# Patient Record
Sex: Female | Born: 1940 | Race: Black or African American | Hispanic: No
Health system: Southern US, Community
[De-identification: ages and names within clinical notes are randomized; demographics above are authoritative.]

---

## 2015-07-04 DIAGNOSIS — J069 Acute upper respiratory infection, unspecified: Secondary | ICD-10-CM | POA: Diagnosis not present

## 2015-09-21 DIAGNOSIS — L03031 Cellulitis of right toe: Secondary | ICD-10-CM | POA: Diagnosis not present

## 2015-09-21 DIAGNOSIS — M79671 Pain in right foot: Secondary | ICD-10-CM | POA: Diagnosis not present

## 2015-09-21 DIAGNOSIS — L600 Ingrowing nail: Secondary | ICD-10-CM | POA: Diagnosis not present

## 2015-09-21 DIAGNOSIS — M79674 Pain in right toe(s): Secondary | ICD-10-CM | POA: Diagnosis not present

## 2015-12-21 DIAGNOSIS — R5383 Other fatigue: Secondary | ICD-10-CM | POA: Diagnosis not present

## 2015-12-21 DIAGNOSIS — E7800 Pure hypercholesterolemia, unspecified: Secondary | ICD-10-CM | POA: Diagnosis not present

## 2015-12-21 DIAGNOSIS — Z1389 Encounter for screening for other disorder: Secondary | ICD-10-CM | POA: Diagnosis not present

## 2015-12-21 DIAGNOSIS — Z1211 Encounter for screening for malignant neoplasm of colon: Secondary | ICD-10-CM | POA: Diagnosis not present

## 2015-12-21 DIAGNOSIS — Z6831 Body mass index (BMI) 31.0-31.9, adult: Secondary | ICD-10-CM | POA: Diagnosis not present

## 2015-12-21 DIAGNOSIS — Z299 Encounter for prophylactic measures, unspecified: Secondary | ICD-10-CM | POA: Diagnosis not present

## 2015-12-21 DIAGNOSIS — Z79899 Other long term (current) drug therapy: Secondary | ICD-10-CM | POA: Diagnosis not present

## 2015-12-21 DIAGNOSIS — Z7189 Other specified counseling: Secondary | ICD-10-CM | POA: Diagnosis not present

## 2015-12-21 DIAGNOSIS — Z0000 Encounter for general adult medical examination without abnormal findings: Secondary | ICD-10-CM | POA: Diagnosis not present

## 2016-01-01 DIAGNOSIS — E7800 Pure hypercholesterolemia, unspecified: Secondary | ICD-10-CM | POA: Diagnosis not present

## 2016-01-01 DIAGNOSIS — I10 Essential (primary) hypertension: Secondary | ICD-10-CM | POA: Diagnosis not present

## 2016-02-05 DIAGNOSIS — I10 Essential (primary) hypertension: Secondary | ICD-10-CM | POA: Diagnosis not present

## 2016-02-05 DIAGNOSIS — E7800 Pure hypercholesterolemia, unspecified: Secondary | ICD-10-CM | POA: Diagnosis not present

## 2016-02-11 DIAGNOSIS — E2839 Other primary ovarian failure: Secondary | ICD-10-CM | POA: Diagnosis not present

## 2016-02-22 DIAGNOSIS — Z1231 Encounter for screening mammogram for malignant neoplasm of breast: Secondary | ICD-10-CM | POA: Diagnosis not present

## 2016-02-29 DIAGNOSIS — J019 Acute sinusitis, unspecified: Secondary | ICD-10-CM | POA: Diagnosis not present

## 2016-02-29 DIAGNOSIS — H6121 Impacted cerumen, right ear: Secondary | ICD-10-CM | POA: Diagnosis not present

## 2016-02-29 DIAGNOSIS — I10 Essential (primary) hypertension: Secondary | ICD-10-CM | POA: Diagnosis not present

## 2016-03-02 DIAGNOSIS — Z23 Encounter for immunization: Secondary | ICD-10-CM | POA: Diagnosis not present

## 2016-03-04 DIAGNOSIS — I10 Essential (primary) hypertension: Secondary | ICD-10-CM | POA: Diagnosis not present

## 2016-03-04 DIAGNOSIS — E7800 Pure hypercholesterolemia, unspecified: Secondary | ICD-10-CM | POA: Diagnosis not present

## 2016-03-17 DIAGNOSIS — Z1211 Encounter for screening for malignant neoplasm of colon: Secondary | ICD-10-CM | POA: Diagnosis not present

## 2016-03-17 DIAGNOSIS — M8580 Other specified disorders of bone density and structure, unspecified site: Secondary | ICD-10-CM | POA: Diagnosis not present

## 2016-03-17 DIAGNOSIS — M1990 Unspecified osteoarthritis, unspecified site: Secondary | ICD-10-CM | POA: Diagnosis not present

## 2016-03-17 DIAGNOSIS — K644 Residual hemorrhoidal skin tags: Secondary | ICD-10-CM | POA: Diagnosis not present

## 2016-03-17 DIAGNOSIS — I10 Essential (primary) hypertension: Secondary | ICD-10-CM | POA: Diagnosis not present

## 2016-03-17 DIAGNOSIS — Z6829 Body mass index (BMI) 29.0-29.9, adult: Secondary | ICD-10-CM | POA: Diagnosis not present

## 2016-03-17 DIAGNOSIS — E669 Obesity, unspecified: Secondary | ICD-10-CM | POA: Diagnosis not present

## 2016-04-01 DIAGNOSIS — I10 Essential (primary) hypertension: Secondary | ICD-10-CM | POA: Diagnosis not present

## 2016-04-01 DIAGNOSIS — E7800 Pure hypercholesterolemia, unspecified: Secondary | ICD-10-CM | POA: Diagnosis not present

## 2016-05-25 DIAGNOSIS — I10 Essential (primary) hypertension: Secondary | ICD-10-CM | POA: Diagnosis not present

## 2016-05-25 DIAGNOSIS — E7800 Pure hypercholesterolemia, unspecified: Secondary | ICD-10-CM | POA: Diagnosis not present

## 2016-07-14 DIAGNOSIS — E7800 Pure hypercholesterolemia, unspecified: Secondary | ICD-10-CM | POA: Diagnosis not present

## 2016-07-14 DIAGNOSIS — I10 Essential (primary) hypertension: Secondary | ICD-10-CM | POA: Diagnosis not present

## 2016-08-11 DIAGNOSIS — E7800 Pure hypercholesterolemia, unspecified: Secondary | ICD-10-CM | POA: Diagnosis not present

## 2016-08-11 DIAGNOSIS — I10 Essential (primary) hypertension: Secondary | ICD-10-CM | POA: Diagnosis not present

## 2016-09-09 DIAGNOSIS — E7800 Pure hypercholesterolemia, unspecified: Secondary | ICD-10-CM | POA: Diagnosis not present

## 2016-09-09 DIAGNOSIS — I10 Essential (primary) hypertension: Secondary | ICD-10-CM | POA: Diagnosis not present

## 2016-10-11 DIAGNOSIS — E7800 Pure hypercholesterolemia, unspecified: Secondary | ICD-10-CM | POA: Diagnosis not present

## 2016-10-11 DIAGNOSIS — I10 Essential (primary) hypertension: Secondary | ICD-10-CM | POA: Diagnosis not present

## 2016-11-01 DIAGNOSIS — E7800 Pure hypercholesterolemia, unspecified: Secondary | ICD-10-CM | POA: Diagnosis not present

## 2016-11-01 DIAGNOSIS — I10 Essential (primary) hypertension: Secondary | ICD-10-CM | POA: Diagnosis not present

## 2016-12-06 DIAGNOSIS — I10 Essential (primary) hypertension: Secondary | ICD-10-CM | POA: Diagnosis not present

## 2016-12-06 DIAGNOSIS — E7800 Pure hypercholesterolemia, unspecified: Secondary | ICD-10-CM | POA: Diagnosis not present

## 2016-12-27 DIAGNOSIS — Z7189 Other specified counseling: Secondary | ICD-10-CM | POA: Diagnosis not present

## 2016-12-27 DIAGNOSIS — M8580 Other specified disorders of bone density and structure, unspecified site: Secondary | ICD-10-CM | POA: Diagnosis not present

## 2016-12-27 DIAGNOSIS — R5383 Other fatigue: Secondary | ICD-10-CM | POA: Diagnosis not present

## 2016-12-27 DIAGNOSIS — Z299 Encounter for prophylactic measures, unspecified: Secondary | ICD-10-CM | POA: Diagnosis not present

## 2016-12-27 DIAGNOSIS — Z79899 Other long term (current) drug therapy: Secondary | ICD-10-CM | POA: Diagnosis not present

## 2016-12-27 DIAGNOSIS — I10 Essential (primary) hypertension: Secondary | ICD-10-CM | POA: Diagnosis not present

## 2016-12-27 DIAGNOSIS — F419 Anxiety disorder, unspecified: Secondary | ICD-10-CM | POA: Diagnosis not present

## 2016-12-27 DIAGNOSIS — Z6832 Body mass index (BMI) 32.0-32.9, adult: Secondary | ICD-10-CM | POA: Diagnosis not present

## 2016-12-27 DIAGNOSIS — Z0000 Encounter for general adult medical examination without abnormal findings: Secondary | ICD-10-CM | POA: Diagnosis not present

## 2016-12-27 DIAGNOSIS — Z1389 Encounter for screening for other disorder: Secondary | ICD-10-CM | POA: Diagnosis not present

## 2016-12-27 DIAGNOSIS — E7800 Pure hypercholesterolemia, unspecified: Secondary | ICD-10-CM | POA: Diagnosis not present

## 2016-12-28 DIAGNOSIS — E7800 Pure hypercholesterolemia, unspecified: Secondary | ICD-10-CM | POA: Diagnosis not present

## 2016-12-28 DIAGNOSIS — I10 Essential (primary) hypertension: Secondary | ICD-10-CM | POA: Diagnosis not present

## 2017-02-09 DIAGNOSIS — E7800 Pure hypercholesterolemia, unspecified: Secondary | ICD-10-CM | POA: Diagnosis not present

## 2017-02-09 DIAGNOSIS — I10 Essential (primary) hypertension: Secondary | ICD-10-CM | POA: Diagnosis not present

## 2017-02-24 DIAGNOSIS — Z1231 Encounter for screening mammogram for malignant neoplasm of breast: Secondary | ICD-10-CM | POA: Diagnosis not present

## 2017-03-04 DIAGNOSIS — Z23 Encounter for immunization: Secondary | ICD-10-CM | POA: Diagnosis not present

## 2017-05-01 DIAGNOSIS — I10 Essential (primary) hypertension: Secondary | ICD-10-CM | POA: Diagnosis not present

## 2017-05-01 DIAGNOSIS — E7800 Pure hypercholesterolemia, unspecified: Secondary | ICD-10-CM | POA: Diagnosis not present

## 2017-06-12 DIAGNOSIS — I10 Essential (primary) hypertension: Secondary | ICD-10-CM | POA: Diagnosis not present

## 2017-06-12 DIAGNOSIS — E7800 Pure hypercholesterolemia, unspecified: Secondary | ICD-10-CM | POA: Diagnosis not present

## 2017-07-03 DIAGNOSIS — M1711 Unilateral primary osteoarthritis, right knee: Secondary | ICD-10-CM | POA: Diagnosis not present

## 2017-07-03 DIAGNOSIS — M25561 Pain in right knee: Secondary | ICD-10-CM | POA: Diagnosis not present

## 2017-07-13 DIAGNOSIS — I10 Essential (primary) hypertension: Secondary | ICD-10-CM | POA: Diagnosis not present

## 2017-07-13 DIAGNOSIS — E7800 Pure hypercholesterolemia, unspecified: Secondary | ICD-10-CM | POA: Diagnosis not present

## 2017-07-16 DIAGNOSIS — J209 Acute bronchitis, unspecified: Secondary | ICD-10-CM | POA: Diagnosis not present

## 2017-07-27 DIAGNOSIS — E7800 Pure hypercholesterolemia, unspecified: Secondary | ICD-10-CM | POA: Diagnosis not present

## 2017-07-27 DIAGNOSIS — I10 Essential (primary) hypertension: Secondary | ICD-10-CM | POA: Diagnosis not present

## 2017-10-18 DIAGNOSIS — E7800 Pure hypercholesterolemia, unspecified: Secondary | ICD-10-CM | POA: Diagnosis not present

## 2017-10-18 DIAGNOSIS — I10 Essential (primary) hypertension: Secondary | ICD-10-CM | POA: Diagnosis not present

## 2017-11-03 DIAGNOSIS — E7800 Pure hypercholesterolemia, unspecified: Secondary | ICD-10-CM | POA: Diagnosis not present

## 2017-11-03 DIAGNOSIS — I10 Essential (primary) hypertension: Secondary | ICD-10-CM | POA: Diagnosis not present

## 2017-12-15 DIAGNOSIS — E7800 Pure hypercholesterolemia, unspecified: Secondary | ICD-10-CM | POA: Diagnosis not present

## 2017-12-15 DIAGNOSIS — I10 Essential (primary) hypertension: Secondary | ICD-10-CM | POA: Diagnosis not present

## 2018-01-04 DIAGNOSIS — E7800 Pure hypercholesterolemia, unspecified: Secondary | ICD-10-CM | POA: Diagnosis not present

## 2018-01-04 DIAGNOSIS — R5383 Other fatigue: Secondary | ICD-10-CM | POA: Diagnosis not present

## 2018-01-04 DIAGNOSIS — Z6832 Body mass index (BMI) 32.0-32.9, adult: Secondary | ICD-10-CM | POA: Diagnosis not present

## 2018-01-04 DIAGNOSIS — Z1339 Encounter for screening examination for other mental health and behavioral disorders: Secondary | ICD-10-CM | POA: Diagnosis not present

## 2018-01-04 DIAGNOSIS — Z0000 Encounter for general adult medical examination without abnormal findings: Secondary | ICD-10-CM | POA: Diagnosis not present

## 2018-01-04 DIAGNOSIS — Z299 Encounter for prophylactic measures, unspecified: Secondary | ICD-10-CM | POA: Diagnosis not present

## 2018-01-04 DIAGNOSIS — Z1331 Encounter for screening for depression: Secondary | ICD-10-CM | POA: Diagnosis not present

## 2018-01-04 DIAGNOSIS — I10 Essential (primary) hypertension: Secondary | ICD-10-CM | POA: Diagnosis not present

## 2018-01-04 DIAGNOSIS — Z7189 Other specified counseling: Secondary | ICD-10-CM | POA: Diagnosis not present

## 2018-01-04 DIAGNOSIS — Z79899 Other long term (current) drug therapy: Secondary | ICD-10-CM | POA: Diagnosis not present

## 2018-01-04 DIAGNOSIS — Z1211 Encounter for screening for malignant neoplasm of colon: Secondary | ICD-10-CM | POA: Diagnosis not present

## 2018-01-04 DIAGNOSIS — Z789 Other specified health status: Secondary | ICD-10-CM | POA: Diagnosis not present

## 2018-02-01 DIAGNOSIS — I10 Essential (primary) hypertension: Secondary | ICD-10-CM | POA: Diagnosis not present

## 2018-02-01 DIAGNOSIS — E7800 Pure hypercholesterolemia, unspecified: Secondary | ICD-10-CM | POA: Diagnosis not present

## 2018-02-06 DIAGNOSIS — M79671 Pain in right foot: Secondary | ICD-10-CM | POA: Diagnosis not present

## 2018-02-06 DIAGNOSIS — M25579 Pain in unspecified ankle and joints of unspecified foot: Secondary | ICD-10-CM | POA: Diagnosis not present

## 2018-02-14 DIAGNOSIS — E2839 Other primary ovarian failure: Secondary | ICD-10-CM | POA: Diagnosis not present

## 2018-02-27 DIAGNOSIS — E7800 Pure hypercholesterolemia, unspecified: Secondary | ICD-10-CM | POA: Diagnosis not present

## 2018-02-27 DIAGNOSIS — I10 Essential (primary) hypertension: Secondary | ICD-10-CM | POA: Diagnosis not present

## 2018-03-03 DIAGNOSIS — Z23 Encounter for immunization: Secondary | ICD-10-CM | POA: Diagnosis not present

## 2018-03-06 DIAGNOSIS — M79671 Pain in right foot: Secondary | ICD-10-CM | POA: Diagnosis not present

## 2018-03-06 DIAGNOSIS — M25579 Pain in unspecified ankle and joints of unspecified foot: Secondary | ICD-10-CM | POA: Diagnosis not present

## 2018-03-23 DIAGNOSIS — E7800 Pure hypercholesterolemia, unspecified: Secondary | ICD-10-CM | POA: Diagnosis not present

## 2018-03-23 DIAGNOSIS — I10 Essential (primary) hypertension: Secondary | ICD-10-CM | POA: Diagnosis not present

## 2018-03-27 DIAGNOSIS — M79671 Pain in right foot: Secondary | ICD-10-CM | POA: Diagnosis not present

## 2018-03-27 DIAGNOSIS — M25579 Pain in unspecified ankle and joints of unspecified foot: Secondary | ICD-10-CM | POA: Diagnosis not present

## 2018-04-04 DIAGNOSIS — Z1231 Encounter for screening mammogram for malignant neoplasm of breast: Secondary | ICD-10-CM | POA: Diagnosis not present

## 2018-04-17 DIAGNOSIS — M79672 Pain in left foot: Secondary | ICD-10-CM | POA: Diagnosis not present

## 2018-04-17 DIAGNOSIS — I739 Peripheral vascular disease, unspecified: Secondary | ICD-10-CM | POA: Diagnosis not present

## 2018-04-17 DIAGNOSIS — M79671 Pain in right foot: Secondary | ICD-10-CM | POA: Diagnosis not present

## 2018-04-24 DIAGNOSIS — E7800 Pure hypercholesterolemia, unspecified: Secondary | ICD-10-CM | POA: Diagnosis not present

## 2018-04-24 DIAGNOSIS — I10 Essential (primary) hypertension: Secondary | ICD-10-CM | POA: Diagnosis not present

## 2018-06-13 DIAGNOSIS — I10 Essential (primary) hypertension: Secondary | ICD-10-CM | POA: Diagnosis not present

## 2018-06-13 DIAGNOSIS — E7800 Pure hypercholesterolemia, unspecified: Secondary | ICD-10-CM | POA: Diagnosis not present

## 2018-07-10 DIAGNOSIS — I739 Peripheral vascular disease, unspecified: Secondary | ICD-10-CM | POA: Diagnosis not present

## 2018-07-10 DIAGNOSIS — M79671 Pain in right foot: Secondary | ICD-10-CM | POA: Diagnosis not present

## 2018-07-10 DIAGNOSIS — M79672 Pain in left foot: Secondary | ICD-10-CM | POA: Diagnosis not present

## 2018-07-11 DIAGNOSIS — E7800 Pure hypercholesterolemia, unspecified: Secondary | ICD-10-CM | POA: Diagnosis not present

## 2018-07-11 DIAGNOSIS — I10 Essential (primary) hypertension: Secondary | ICD-10-CM | POA: Diagnosis not present

## 2018-08-07 DIAGNOSIS — E7800 Pure hypercholesterolemia, unspecified: Secondary | ICD-10-CM | POA: Diagnosis not present

## 2018-08-07 DIAGNOSIS — I10 Essential (primary) hypertension: Secondary | ICD-10-CM | POA: Diagnosis not present

## 2018-09-13 DIAGNOSIS — E7800 Pure hypercholesterolemia, unspecified: Secondary | ICD-10-CM | POA: Diagnosis not present

## 2018-09-13 DIAGNOSIS — I10 Essential (primary) hypertension: Secondary | ICD-10-CM | POA: Diagnosis not present

## 2018-10-02 DIAGNOSIS — M79671 Pain in right foot: Secondary | ICD-10-CM | POA: Diagnosis not present

## 2018-10-02 DIAGNOSIS — I739 Peripheral vascular disease, unspecified: Secondary | ICD-10-CM | POA: Diagnosis not present

## 2018-10-02 DIAGNOSIS — M79672 Pain in left foot: Secondary | ICD-10-CM | POA: Diagnosis not present

## 2018-10-10 DIAGNOSIS — I10 Essential (primary) hypertension: Secondary | ICD-10-CM | POA: Diagnosis not present

## 2018-10-10 DIAGNOSIS — E7800 Pure hypercholesterolemia, unspecified: Secondary | ICD-10-CM | POA: Diagnosis not present

## 2018-11-08 DIAGNOSIS — E7800 Pure hypercholesterolemia, unspecified: Secondary | ICD-10-CM | POA: Diagnosis not present

## 2018-11-08 DIAGNOSIS — I10 Essential (primary) hypertension: Secondary | ICD-10-CM | POA: Diagnosis not present

## 2018-12-07 DIAGNOSIS — E7800 Pure hypercholesterolemia, unspecified: Secondary | ICD-10-CM | POA: Diagnosis not present

## 2018-12-07 DIAGNOSIS — I10 Essential (primary) hypertension: Secondary | ICD-10-CM | POA: Diagnosis not present

## 2018-12-18 DIAGNOSIS — M25774 Osteophyte, right foot: Secondary | ICD-10-CM | POA: Diagnosis not present

## 2018-12-18 DIAGNOSIS — M25775 Osteophyte, left foot: Secondary | ICD-10-CM | POA: Diagnosis not present

## 2018-12-18 DIAGNOSIS — M79671 Pain in right foot: Secondary | ICD-10-CM | POA: Diagnosis not present

## 2018-12-18 DIAGNOSIS — M79672 Pain in left foot: Secondary | ICD-10-CM | POA: Diagnosis not present

## 2019-01-07 DIAGNOSIS — E7800 Pure hypercholesterolemia, unspecified: Secondary | ICD-10-CM | POA: Diagnosis not present

## 2019-01-07 DIAGNOSIS — I10 Essential (primary) hypertension: Secondary | ICD-10-CM | POA: Diagnosis not present

## 2019-01-14 DIAGNOSIS — Z79899 Other long term (current) drug therapy: Secondary | ICD-10-CM | POA: Diagnosis not present

## 2019-01-14 DIAGNOSIS — Z6834 Body mass index (BMI) 34.0-34.9, adult: Secondary | ICD-10-CM | POA: Diagnosis not present

## 2019-01-14 DIAGNOSIS — I10 Essential (primary) hypertension: Secondary | ICD-10-CM | POA: Diagnosis not present

## 2019-01-14 DIAGNOSIS — R5383 Other fatigue: Secondary | ICD-10-CM | POA: Diagnosis not present

## 2019-01-14 DIAGNOSIS — Z0000 Encounter for general adult medical examination without abnormal findings: Secondary | ICD-10-CM | POA: Diagnosis not present

## 2019-01-14 DIAGNOSIS — Z1339 Encounter for screening examination for other mental health and behavioral disorders: Secondary | ICD-10-CM | POA: Diagnosis not present

## 2019-01-14 DIAGNOSIS — Z1331 Encounter for screening for depression: Secondary | ICD-10-CM | POA: Diagnosis not present

## 2019-01-14 DIAGNOSIS — F419 Anxiety disorder, unspecified: Secondary | ICD-10-CM | POA: Diagnosis not present

## 2019-01-14 DIAGNOSIS — E559 Vitamin D deficiency, unspecified: Secondary | ICD-10-CM | POA: Diagnosis not present

## 2019-01-14 DIAGNOSIS — E7800 Pure hypercholesterolemia, unspecified: Secondary | ICD-10-CM | POA: Diagnosis not present

## 2019-01-14 DIAGNOSIS — Z1211 Encounter for screening for malignant neoplasm of colon: Secondary | ICD-10-CM | POA: Diagnosis not present

## 2019-01-14 DIAGNOSIS — Z299 Encounter for prophylactic measures, unspecified: Secondary | ICD-10-CM | POA: Diagnosis not present

## 2019-01-14 DIAGNOSIS — Z7189 Other specified counseling: Secondary | ICD-10-CM | POA: Diagnosis not present

## 2019-02-05 DIAGNOSIS — I10 Essential (primary) hypertension: Secondary | ICD-10-CM | POA: Diagnosis not present

## 2019-02-05 DIAGNOSIS — E7800 Pure hypercholesterolemia, unspecified: Secondary | ICD-10-CM | POA: Diagnosis not present

## 2019-02-06 DIAGNOSIS — Z23 Encounter for immunization: Secondary | ICD-10-CM | POA: Diagnosis not present

## 2019-02-26 DIAGNOSIS — M79671 Pain in right foot: Secondary | ICD-10-CM | POA: Diagnosis not present

## 2019-02-26 DIAGNOSIS — M79672 Pain in left foot: Secondary | ICD-10-CM | POA: Diagnosis not present

## 2019-02-26 DIAGNOSIS — I739 Peripheral vascular disease, unspecified: Secondary | ICD-10-CM | POA: Diagnosis not present

## 2019-03-05 DIAGNOSIS — I10 Essential (primary) hypertension: Secondary | ICD-10-CM | POA: Diagnosis not present

## 2019-03-05 DIAGNOSIS — E7800 Pure hypercholesterolemia, unspecified: Secondary | ICD-10-CM | POA: Diagnosis not present

## 2019-04-08 DIAGNOSIS — Z1231 Encounter for screening mammogram for malignant neoplasm of breast: Secondary | ICD-10-CM | POA: Diagnosis not present

## 2019-05-07 DIAGNOSIS — I10 Essential (primary) hypertension: Secondary | ICD-10-CM | POA: Diagnosis not present

## 2019-05-07 DIAGNOSIS — E7800 Pure hypercholesterolemia, unspecified: Secondary | ICD-10-CM | POA: Diagnosis not present

## 2019-06-05 DIAGNOSIS — I10 Essential (primary) hypertension: Secondary | ICD-10-CM | POA: Diagnosis not present

## 2019-06-05 DIAGNOSIS — E7800 Pure hypercholesterolemia, unspecified: Secondary | ICD-10-CM | POA: Diagnosis not present

## 2019-07-03 DIAGNOSIS — E7800 Pure hypercholesterolemia, unspecified: Secondary | ICD-10-CM | POA: Diagnosis not present

## 2019-07-03 DIAGNOSIS — I10 Essential (primary) hypertension: Secondary | ICD-10-CM | POA: Diagnosis not present

## 2019-07-13 DIAGNOSIS — Z23 Encounter for immunization: Secondary | ICD-10-CM | POA: Diagnosis not present

## 2019-07-23 DIAGNOSIS — M79672 Pain in left foot: Secondary | ICD-10-CM | POA: Diagnosis not present

## 2019-07-23 DIAGNOSIS — I739 Peripheral vascular disease, unspecified: Secondary | ICD-10-CM | POA: Diagnosis not present

## 2019-07-23 DIAGNOSIS — M79671 Pain in right foot: Secondary | ICD-10-CM | POA: Diagnosis not present

## 2019-07-30 DIAGNOSIS — I10 Essential (primary) hypertension: Secondary | ICD-10-CM | POA: Diagnosis not present

## 2019-07-30 DIAGNOSIS — E7800 Pure hypercholesterolemia, unspecified: Secondary | ICD-10-CM | POA: Diagnosis not present

## 2019-08-10 DIAGNOSIS — Z23 Encounter for immunization: Secondary | ICD-10-CM | POA: Diagnosis not present

## 2019-09-12 DIAGNOSIS — E7800 Pure hypercholesterolemia, unspecified: Secondary | ICD-10-CM | POA: Diagnosis not present

## 2019-09-12 DIAGNOSIS — I10 Essential (primary) hypertension: Secondary | ICD-10-CM | POA: Diagnosis not present

## 2019-10-08 DIAGNOSIS — M79671 Pain in right foot: Secondary | ICD-10-CM | POA: Diagnosis not present

## 2019-10-08 DIAGNOSIS — M79672 Pain in left foot: Secondary | ICD-10-CM | POA: Diagnosis not present

## 2019-10-08 DIAGNOSIS — I739 Peripheral vascular disease, unspecified: Secondary | ICD-10-CM | POA: Diagnosis not present

## 2019-10-20 DIAGNOSIS — E7800 Pure hypercholesterolemia, unspecified: Secondary | ICD-10-CM | POA: Diagnosis not present

## 2019-10-20 DIAGNOSIS — I10 Essential (primary) hypertension: Secondary | ICD-10-CM | POA: Diagnosis not present

## 2019-11-20 DIAGNOSIS — E7800 Pure hypercholesterolemia, unspecified: Secondary | ICD-10-CM | POA: Diagnosis not present

## 2019-11-20 DIAGNOSIS — I10 Essential (primary) hypertension: Secondary | ICD-10-CM | POA: Diagnosis not present

## 2019-12-20 DIAGNOSIS — E7800 Pure hypercholesterolemia, unspecified: Secondary | ICD-10-CM | POA: Diagnosis not present

## 2019-12-20 DIAGNOSIS — I10 Essential (primary) hypertension: Secondary | ICD-10-CM | POA: Diagnosis not present

## 2019-12-24 DIAGNOSIS — M79672 Pain in left foot: Secondary | ICD-10-CM | POA: Diagnosis not present

## 2019-12-24 DIAGNOSIS — I739 Peripheral vascular disease, unspecified: Secondary | ICD-10-CM | POA: Diagnosis not present

## 2019-12-24 DIAGNOSIS — M79671 Pain in right foot: Secondary | ICD-10-CM | POA: Diagnosis not present

## 2019-12-26 DIAGNOSIS — E7800 Pure hypercholesterolemia, unspecified: Secondary | ICD-10-CM | POA: Diagnosis not present

## 2019-12-26 DIAGNOSIS — I10 Essential (primary) hypertension: Secondary | ICD-10-CM | POA: Diagnosis not present

## 2020-01-29 DIAGNOSIS — H2513 Age-related nuclear cataract, bilateral: Secondary | ICD-10-CM | POA: Diagnosis not present

## 2020-01-29 DIAGNOSIS — H2512 Age-related nuclear cataract, left eye: Secondary | ICD-10-CM | POA: Diagnosis not present

## 2020-02-03 DIAGNOSIS — Z6833 Body mass index (BMI) 33.0-33.9, adult: Secondary | ICD-10-CM | POA: Diagnosis not present

## 2020-02-03 DIAGNOSIS — Z299 Encounter for prophylactic measures, unspecified: Secondary | ICD-10-CM | POA: Diagnosis not present

## 2020-02-03 DIAGNOSIS — E7800 Pure hypercholesterolemia, unspecified: Secondary | ICD-10-CM | POA: Diagnosis not present

## 2020-02-03 DIAGNOSIS — I10 Essential (primary) hypertension: Secondary | ICD-10-CM | POA: Diagnosis not present

## 2020-02-03 DIAGNOSIS — Z1331 Encounter for screening for depression: Secondary | ICD-10-CM | POA: Diagnosis not present

## 2020-02-03 DIAGNOSIS — R5383 Other fatigue: Secondary | ICD-10-CM | POA: Diagnosis not present

## 2020-02-03 DIAGNOSIS — Z79899 Other long term (current) drug therapy: Secondary | ICD-10-CM | POA: Diagnosis not present

## 2020-02-03 DIAGNOSIS — Z0000 Encounter for general adult medical examination without abnormal findings: Secondary | ICD-10-CM | POA: Diagnosis not present

## 2020-02-03 DIAGNOSIS — Z1339 Encounter for screening examination for other mental health and behavioral disorders: Secondary | ICD-10-CM | POA: Diagnosis not present

## 2020-02-03 DIAGNOSIS — Z7189 Other specified counseling: Secondary | ICD-10-CM | POA: Diagnosis not present

## 2020-02-20 DIAGNOSIS — I10 Essential (primary) hypertension: Secondary | ICD-10-CM | POA: Diagnosis not present

## 2020-02-20 DIAGNOSIS — E7800 Pure hypercholesterolemia, unspecified: Secondary | ICD-10-CM | POA: Diagnosis not present

## 2020-03-04 DIAGNOSIS — H2512 Age-related nuclear cataract, left eye: Secondary | ICD-10-CM | POA: Diagnosis not present

## 2020-03-09 DIAGNOSIS — Z23 Encounter for immunization: Secondary | ICD-10-CM | POA: Diagnosis not present

## 2020-03-10 DIAGNOSIS — I739 Peripheral vascular disease, unspecified: Secondary | ICD-10-CM | POA: Diagnosis not present

## 2020-03-10 DIAGNOSIS — M79672 Pain in left foot: Secondary | ICD-10-CM | POA: Diagnosis not present

## 2020-03-10 DIAGNOSIS — M79671 Pain in right foot: Secondary | ICD-10-CM | POA: Diagnosis not present

## 2020-03-18 DIAGNOSIS — H2511 Age-related nuclear cataract, right eye: Secondary | ICD-10-CM | POA: Diagnosis not present

## 2020-03-20 DIAGNOSIS — E7800 Pure hypercholesterolemia, unspecified: Secondary | ICD-10-CM | POA: Diagnosis not present

## 2020-03-20 DIAGNOSIS — I10 Essential (primary) hypertension: Secondary | ICD-10-CM | POA: Diagnosis not present

## 2020-04-06 DIAGNOSIS — E2839 Other primary ovarian failure: Secondary | ICD-10-CM | POA: Diagnosis not present

## 2020-04-09 DIAGNOSIS — Z1231 Encounter for screening mammogram for malignant neoplasm of breast: Secondary | ICD-10-CM | POA: Diagnosis not present

## 2020-04-21 DIAGNOSIS — E7800 Pure hypercholesterolemia, unspecified: Secondary | ICD-10-CM | POA: Diagnosis not present

## 2020-04-21 DIAGNOSIS — I10 Essential (primary) hypertension: Secondary | ICD-10-CM | POA: Diagnosis not present

## 2020-05-21 DIAGNOSIS — I10 Essential (primary) hypertension: Secondary | ICD-10-CM | POA: Diagnosis not present

## 2020-05-21 DIAGNOSIS — E7800 Pure hypercholesterolemia, unspecified: Secondary | ICD-10-CM | POA: Diagnosis not present

## 2020-06-02 DIAGNOSIS — M79672 Pain in left foot: Secondary | ICD-10-CM | POA: Diagnosis not present

## 2020-06-02 DIAGNOSIS — I739 Peripheral vascular disease, unspecified: Secondary | ICD-10-CM | POA: Diagnosis not present

## 2020-06-02 DIAGNOSIS — M79671 Pain in right foot: Secondary | ICD-10-CM | POA: Diagnosis not present

## 2020-08-17 DIAGNOSIS — I739 Peripheral vascular disease, unspecified: Secondary | ICD-10-CM | POA: Diagnosis not present

## 2020-08-17 DIAGNOSIS — M79672 Pain in left foot: Secondary | ICD-10-CM | POA: Diagnosis not present

## 2020-08-17 DIAGNOSIS — M79671 Pain in right foot: Secondary | ICD-10-CM | POA: Diagnosis not present

## 2020-09-07 DIAGNOSIS — Z23 Encounter for immunization: Secondary | ICD-10-CM | POA: Diagnosis not present

## 2020-11-09 DIAGNOSIS — I739 Peripheral vascular disease, unspecified: Secondary | ICD-10-CM | POA: Diagnosis not present

## 2020-11-09 DIAGNOSIS — Z299 Encounter for prophylactic measures, unspecified: Secondary | ICD-10-CM | POA: Diagnosis not present

## 2020-11-09 DIAGNOSIS — M79671 Pain in right foot: Secondary | ICD-10-CM | POA: Diagnosis not present

## 2020-11-09 DIAGNOSIS — I10 Essential (primary) hypertension: Secondary | ICD-10-CM | POA: Diagnosis not present

## 2020-11-09 DIAGNOSIS — M79672 Pain in left foot: Secondary | ICD-10-CM | POA: Diagnosis not present

## 2020-11-09 DIAGNOSIS — M7522 Bicipital tendinitis, left shoulder: Secondary | ICD-10-CM | POA: Diagnosis not present

## 2021-02-04 DIAGNOSIS — Z6832 Body mass index (BMI) 32.0-32.9, adult: Secondary | ICD-10-CM | POA: Diagnosis not present

## 2021-02-04 DIAGNOSIS — Z1339 Encounter for screening examination for other mental health and behavioral disorders: Secondary | ICD-10-CM | POA: Diagnosis not present

## 2021-02-04 DIAGNOSIS — Z1331 Encounter for screening for depression: Secondary | ICD-10-CM | POA: Diagnosis not present

## 2021-02-04 DIAGNOSIS — E7800 Pure hypercholesterolemia, unspecified: Secondary | ICD-10-CM | POA: Diagnosis not present

## 2021-02-04 DIAGNOSIS — Z0000 Encounter for general adult medical examination without abnormal findings: Secondary | ICD-10-CM | POA: Diagnosis not present

## 2021-02-04 DIAGNOSIS — Z299 Encounter for prophylactic measures, unspecified: Secondary | ICD-10-CM | POA: Diagnosis not present

## 2021-02-04 DIAGNOSIS — I10 Essential (primary) hypertension: Secondary | ICD-10-CM | POA: Diagnosis not present

## 2021-02-04 DIAGNOSIS — Z79899 Other long term (current) drug therapy: Secondary | ICD-10-CM | POA: Diagnosis not present

## 2021-02-04 DIAGNOSIS — Z7189 Other specified counseling: Secondary | ICD-10-CM | POA: Diagnosis not present

## 2021-02-04 DIAGNOSIS — R5383 Other fatigue: Secondary | ICD-10-CM | POA: Diagnosis not present

## 2021-02-04 DIAGNOSIS — Z23 Encounter for immunization: Secondary | ICD-10-CM | POA: Diagnosis not present

## 2021-03-23 ENCOUNTER — Other Ambulatory Visit: Payer: Self-pay | Admitting: Internal Medicine

## 2021-03-23 DIAGNOSIS — Z139 Encounter for screening, unspecified: Secondary | ICD-10-CM

## 2021-03-30 ENCOUNTER — Other Ambulatory Visit: Payer: Self-pay

## 2021-03-30 ENCOUNTER — Ambulatory Visit
Admission: RE | Admit: 2021-03-30 | Discharge: 2021-03-30 | Disposition: A | Discharge status: Home / Self Care | Payer: Medicare Other | Source: Ambulatory Visit | Attending: Internal Medicine | Admitting: Internal Medicine

## 2021-03-30 DIAGNOSIS — Z139 Encounter for screening, unspecified: Secondary | ICD-10-CM

## 2021-03-30 DIAGNOSIS — Z1231 Encounter for screening mammogram for malignant neoplasm of breast: Secondary | ICD-10-CM | POA: Diagnosis not present

## 2021-04-05 DIAGNOSIS — Z23 Encounter for immunization: Secondary | ICD-10-CM | POA: Diagnosis not present

## 2021-05-10 DIAGNOSIS — E7800 Pure hypercholesterolemia, unspecified: Secondary | ICD-10-CM | POA: Diagnosis not present

## 2021-05-10 DIAGNOSIS — Z6833 Body mass index (BMI) 33.0-33.9, adult: Secondary | ICD-10-CM | POA: Diagnosis not present

## 2021-05-10 DIAGNOSIS — M25561 Pain in right knee: Secondary | ICD-10-CM | POA: Diagnosis not present

## 2021-05-10 DIAGNOSIS — I10 Essential (primary) hypertension: Secondary | ICD-10-CM | POA: Diagnosis not present

## 2021-05-10 DIAGNOSIS — Z79899 Other long term (current) drug therapy: Secondary | ICD-10-CM | POA: Diagnosis not present

## 2021-05-10 DIAGNOSIS — Z299 Encounter for prophylactic measures, unspecified: Secondary | ICD-10-CM | POA: Diagnosis not present

## 2021-06-21 DIAGNOSIS — Z800 Family history of malignant neoplasm of digestive organs: Secondary | ICD-10-CM | POA: Diagnosis not present

## 2021-07-29 DIAGNOSIS — Z800 Family history of malignant neoplasm of digestive organs: Secondary | ICD-10-CM | POA: Diagnosis not present

## 2021-07-29 DIAGNOSIS — Z79899 Other long term (current) drug therapy: Secondary | ICD-10-CM | POA: Diagnosis not present

## 2021-07-29 DIAGNOSIS — Z1211 Encounter for screening for malignant neoplasm of colon: Secondary | ICD-10-CM | POA: Diagnosis not present

## 2021-07-29 DIAGNOSIS — K649 Unspecified hemorrhoids: Secondary | ICD-10-CM | POA: Diagnosis not present

## 2021-07-29 DIAGNOSIS — I10 Essential (primary) hypertension: Secondary | ICD-10-CM | POA: Diagnosis not present

## 2021-07-29 DIAGNOSIS — Q438 Other specified congenital malformations of intestine: Secondary | ICD-10-CM | POA: Diagnosis not present

## 2021-07-29 DIAGNOSIS — K644 Residual hemorrhoidal skin tags: Secondary | ICD-10-CM | POA: Diagnosis not present

## 2021-11-02 DIAGNOSIS — Z713 Dietary counseling and surveillance: Secondary | ICD-10-CM | POA: Diagnosis not present

## 2021-11-02 DIAGNOSIS — Z299 Encounter for prophylactic measures, unspecified: Secondary | ICD-10-CM | POA: Diagnosis not present

## 2021-11-02 DIAGNOSIS — M25561 Pain in right knee: Secondary | ICD-10-CM | POA: Diagnosis not present

## 2021-11-02 DIAGNOSIS — Z6832 Body mass index (BMI) 32.0-32.9, adult: Secondary | ICD-10-CM | POA: Diagnosis not present

## 2021-11-02 DIAGNOSIS — I10 Essential (primary) hypertension: Secondary | ICD-10-CM | POA: Diagnosis not present

## 2021-12-03 DIAGNOSIS — Z6832 Body mass index (BMI) 32.0-32.9, adult: Secondary | ICD-10-CM | POA: Diagnosis not present

## 2021-12-03 DIAGNOSIS — R2681 Unsteadiness on feet: Secondary | ICD-10-CM | POA: Diagnosis not present

## 2021-12-03 DIAGNOSIS — M1711 Unilateral primary osteoarthritis, right knee: Secondary | ICD-10-CM | POA: Diagnosis not present

## 2021-12-03 DIAGNOSIS — Z299 Encounter for prophylactic measures, unspecified: Secondary | ICD-10-CM | POA: Diagnosis not present

## 2021-12-03 DIAGNOSIS — M1712 Unilateral primary osteoarthritis, left knee: Secondary | ICD-10-CM | POA: Diagnosis not present

## 2021-12-03 DIAGNOSIS — I10 Essential (primary) hypertension: Secondary | ICD-10-CM | POA: Diagnosis not present

## 2021-12-03 DIAGNOSIS — M25561 Pain in right knee: Secondary | ICD-10-CM | POA: Diagnosis not present

## 2021-12-03 DIAGNOSIS — R296 Repeated falls: Secondary | ICD-10-CM | POA: Diagnosis not present

## 2021-12-03 DIAGNOSIS — M170 Bilateral primary osteoarthritis of knee: Secondary | ICD-10-CM | POA: Diagnosis not present

## 2021-12-03 DIAGNOSIS — M25562 Pain in left knee: Secondary | ICD-10-CM | POA: Diagnosis not present

## 2021-12-29 DIAGNOSIS — J3089 Other allergic rhinitis: Secondary | ICD-10-CM | POA: Diagnosis not present

## 2021-12-29 DIAGNOSIS — Z20822 Contact with and (suspected) exposure to covid-19: Secondary | ICD-10-CM | POA: Diagnosis not present

## 2021-12-29 DIAGNOSIS — J069 Acute upper respiratory infection, unspecified: Secondary | ICD-10-CM | POA: Diagnosis not present

## 2022-01-28 DIAGNOSIS — I10 Essential (primary) hypertension: Secondary | ICD-10-CM | POA: Diagnosis not present

## 2022-01-28 DIAGNOSIS — Z299 Encounter for prophylactic measures, unspecified: Secondary | ICD-10-CM | POA: Diagnosis not present

## 2022-01-28 DIAGNOSIS — M25562 Pain in left knee: Secondary | ICD-10-CM | POA: Diagnosis not present

## 2022-01-28 DIAGNOSIS — M170 Bilateral primary osteoarthritis of knee: Secondary | ICD-10-CM | POA: Diagnosis not present

## 2022-01-28 DIAGNOSIS — Z6831 Body mass index (BMI) 31.0-31.9, adult: Secondary | ICD-10-CM | POA: Diagnosis not present

## 2022-01-28 DIAGNOSIS — R260 Ataxic gait: Secondary | ICD-10-CM | POA: Diagnosis not present

## 2022-01-28 DIAGNOSIS — M25561 Pain in right knee: Secondary | ICD-10-CM | POA: Diagnosis not present

## 2022-02-14 DIAGNOSIS — I10 Essential (primary) hypertension: Secondary | ICD-10-CM | POA: Diagnosis not present

## 2022-02-14 DIAGNOSIS — Z7189 Other specified counseling: Secondary | ICD-10-CM | POA: Diagnosis not present

## 2022-02-14 DIAGNOSIS — Z23 Encounter for immunization: Secondary | ICD-10-CM | POA: Diagnosis not present

## 2022-02-14 DIAGNOSIS — Z1331 Encounter for screening for depression: Secondary | ICD-10-CM | POA: Diagnosis not present

## 2022-02-14 DIAGNOSIS — Z299 Encounter for prophylactic measures, unspecified: Secondary | ICD-10-CM | POA: Diagnosis not present

## 2022-02-14 DIAGNOSIS — R5383 Other fatigue: Secondary | ICD-10-CM | POA: Diagnosis not present

## 2022-02-14 DIAGNOSIS — Z6831 Body mass index (BMI) 31.0-31.9, adult: Secondary | ICD-10-CM | POA: Diagnosis not present

## 2022-02-14 DIAGNOSIS — E7800 Pure hypercholesterolemia, unspecified: Secondary | ICD-10-CM | POA: Diagnosis not present

## 2022-02-14 DIAGNOSIS — Z79899 Other long term (current) drug therapy: Secondary | ICD-10-CM | POA: Diagnosis not present

## 2022-02-14 DIAGNOSIS — Z1339 Encounter for screening examination for other mental health and behavioral disorders: Secondary | ICD-10-CM | POA: Diagnosis not present

## 2022-02-14 DIAGNOSIS — Z0000 Encounter for general adult medical examination without abnormal findings: Secondary | ICD-10-CM | POA: Diagnosis not present

## 2022-02-21 DIAGNOSIS — Z23 Encounter for immunization: Secondary | ICD-10-CM | POA: Diagnosis not present

## 2022-02-24 DIAGNOSIS — M1711 Unilateral primary osteoarthritis, right knee: Secondary | ICD-10-CM | POA: Diagnosis not present

## 2022-02-25 DIAGNOSIS — M1712 Unilateral primary osteoarthritis, left knee: Secondary | ICD-10-CM | POA: Diagnosis not present

## 2022-03-03 DIAGNOSIS — M1711 Unilateral primary osteoarthritis, right knee: Secondary | ICD-10-CM | POA: Diagnosis not present

## 2022-03-04 DIAGNOSIS — M1712 Unilateral primary osteoarthritis, left knee: Secondary | ICD-10-CM | POA: Diagnosis not present

## 2022-03-10 DIAGNOSIS — M1711 Unilateral primary osteoarthritis, right knee: Secondary | ICD-10-CM | POA: Diagnosis not present

## 2022-03-11 DIAGNOSIS — M1712 Unilateral primary osteoarthritis, left knee: Secondary | ICD-10-CM | POA: Diagnosis not present

## 2022-03-17 DIAGNOSIS — M1711 Unilateral primary osteoarthritis, right knee: Secondary | ICD-10-CM | POA: Diagnosis not present

## 2022-03-18 DIAGNOSIS — M1712 Unilateral primary osteoarthritis, left knee: Secondary | ICD-10-CM | POA: Diagnosis not present

## 2022-03-30 ENCOUNTER — Other Ambulatory Visit: Payer: Self-pay | Admitting: Internal Medicine

## 2022-03-30 DIAGNOSIS — Z1231 Encounter for screening mammogram for malignant neoplasm of breast: Secondary | ICD-10-CM

## 2022-04-13 ENCOUNTER — Ambulatory Visit
Admission: RE | Admit: 2022-04-13 | Discharge: 2022-04-13 | Disposition: A | Discharge status: Home / Self Care | Payer: Medicare Other | Source: Ambulatory Visit | Attending: Internal Medicine | Admitting: Internal Medicine

## 2022-04-13 DIAGNOSIS — Z1231 Encounter for screening mammogram for malignant neoplasm of breast: Secondary | ICD-10-CM

## 2022-04-27 ENCOUNTER — Ambulatory Visit (INDEPENDENT_AMBULATORY_CARE_PROVIDER_SITE_OTHER): Payer: Medicare Other | Admitting: Urology

## 2022-04-27 ENCOUNTER — Encounter: Payer: Self-pay | Admitting: Urology

## 2022-04-27 VITALS — BP 146/78 | HR 62 | Wt 179.0 lb

## 2022-04-27 DIAGNOSIS — R350 Frequency of micturition: Secondary | ICD-10-CM | POA: Diagnosis not present

## 2022-04-27 LAB — URINALYSIS, ROUTINE W REFLEX MICROSCOPIC
Bilirubin, UA: NEGATIVE
Glucose, UA: NEGATIVE
Ketones, UA: NEGATIVE
Nitrite, UA: NEGATIVE
Protein,UA: NEGATIVE
RBC, UA: NEGATIVE
Specific Gravity, UA: 1.02 (ref 1.005–1.030)
Urobilinogen, Ur: 0.2 mg/dL (ref 0.2–1.0)
pH, UA: 7.5 (ref 5.0–7.5)

## 2022-04-27 LAB — MICROSCOPIC EXAMINATION: Bacteria, UA: NONE SEEN

## 2022-04-27 MED ORDER — MIRABEGRON ER 25 MG PO TB24: 25.0000 mg | ORAL_TABLET | Freq: Every day | ORAL | 0 refills | Status: DC

## 2022-04-27 NOTE — Progress Notes (Signed)
   04/27/2022 9:51 AM   Beverly Evans May 16, 1941 [id number]  Referring provider: No referring provider defined for this encounter.  nocturia   HPI: Ms Bosko is a 81yo here for evaluation of urinary frequency and nocturia. For over 2 years she has noted urinary frequency every 2-3 hours and nocturia 3x. She has incontinence at night on the way to the bathroom. She has 1+ lower extremity edema. She has not tried an OAB meds.    PMH: No past medical history on file.  Surgical History: No past surgical history on file.  Home Medications:  Allergies as of 04/27/2022   No Known Allergies      Medication List        Accurate as of April 27, 2022  9:51 AM. If you have any questions, ask your nurse or doctor.          amLODipine 10 MG tablet Commonly known as: NORVASC Take 1 tablet by mouth daily.   rosuvastatin 10 MG tablet Commonly known as: CRESTOR Take 10 mg by mouth daily.        Allergies: No Known Allergies  Family History: Family History  Problem Relation Age of Onset   Breast cancer Paternal Grandmother    Breast cancer Paternal Great-grandmother     Social History:  has no history on file for tobacco use, alcohol use, and drug use.  ROS: All other review of systems were reviewed and are negative except what is noted above in HPI  Physical Exam: BP (!) 146/78   Pulse 62   Wt 179 lb (81.2 kg)   Constitutional:  Alert and oriented, No acute distress. HEENT: [place] AT, moist mucus membranes.  Trachea midline, no masses. Cardiovascular: No clubbing, cyanosis, or edema. Respiratory: Normal respiratory effort, no increased work of breathing. GI: Abdomen is soft, nontender, nondistended, no abdominal masses GU: No CVA tenderness.  Lymph: No cervical or inguinal lymphadenopathy. Skin: No rashes, bruises or suspicious lesions. Neurologic: Grossly intact, no focal deficits, moving all 4 extremities. Psychiatric: Normal mood and  affect.  Laboratory Data: No results found for: "WBC", "HGB", "HCT", "MCV", "PLT"  No results found for: "CREATININE"  No results found for: "PSA"  No results found for: "TESTOSTERONE"  No results found for: "HGBA1C"  Urinalysis No results found for: "COLORURINE", "APPEARANCEUR", "LABSPEC", "PHURINE", "GLUCOSEU", "HGBUR", "BILIRUBINUR", "KETONESUR", "PROTEINUR", "UROBILINOGEN", "NITRITE", "LEUKOCYTESUR"  No results found for: "LABMICR", "WBCUA", "RBCUA", "LABEPIT", "MUCUS", "BACTERIA"  Pertinent Imaging:  No results found for this or any previous visit.  No results found for this or any previous visit.  No results found for this or any previous visit.  No results found for this or any previous visit.  No results found for this or any previous visit.  No valid procedures specified. No results found for this or any previous visit.  No results found for this or any previous visit.   Assessment & Plan:    1. Frequent urination -We will trial mirabegron 25mg  daily - Urinalysis, Routine w reflex microscopic - BLADDER SCAN AMB NON-IMAGING   No follow-ups on file.  , MD  [facility] [place]

## 2022-04-27 NOTE — Patient Instructions (Signed)

## 2022-05-19 ENCOUNTER — Telehealth: Payer: Self-pay

## 2022-05-19 DIAGNOSIS — R350 Frequency of micturition: Secondary | ICD-10-CM

## 2022-05-19 MED ORDER — MIRABEGRON ER 25 MG PO TB24: 25.0000 mg | ORAL_TABLET | Freq: Every day | ORAL | 0 refills | Status: DC

## 2022-05-19 NOTE — Telephone Encounter (Signed)
Rx sent to pharmacy   

## 2022-05-19 NOTE — Telephone Encounter (Signed)
Voice message 05-19-22.   The samples given work. Needing a prescription called in.   [facility] [id number] - [place], [place] - [phone] Phone: [phone]  Fax: [phone]     Let pt know this was sent in.  Call back: [phone] (H)   Thanks, Rosey Bath

## 2022-05-30 ENCOUNTER — Other Ambulatory Visit: Payer: Self-pay

## 2022-05-30 ENCOUNTER — Telehealth: Payer: Self-pay

## 2022-05-30 DIAGNOSIS — R350 Frequency of micturition: Secondary | ICD-10-CM

## 2022-05-30 MED ORDER — MIRABEGRON ER 25 MG PO TB24: 25.0000 mg | ORAL_TABLET | Freq: Every day | ORAL | 11 refills | Status: AC

## 2022-05-30 NOTE — Telephone Encounter (Signed)
Patient called and advised they needed medication called into pharmacy.    Medication: mirabegron ER (MYRBETRIQ) 25 MG TB24 tablet    Pharmacy: [facility] [id number] - [place], [place]    Thank you

## 2022-11-04 IMAGING — MG MM DIGITAL SCREENING BILAT W/ TOMO AND CAD
6 of 10 series · 6 of 30 positions shown · non-contrast
Comparison: None.

ACR Breast Density Category a: The breast tissue is almost entirely
fatty.

CLINICAL DATA: Screening.

EXAM:
DIGITAL SCREENING BILATERAL MAMMOGRAM WITH TOMOSYNTHESIS AND CAD
TECHNIQUE: Bilateral screening digital craniocaudal and mediolateral oblique
mammograms were obtained. Bilateral screening digital breast
tomosynthesis was performed. The images were evaluated with
computer-aided detection.

[L MLO synth-2D]
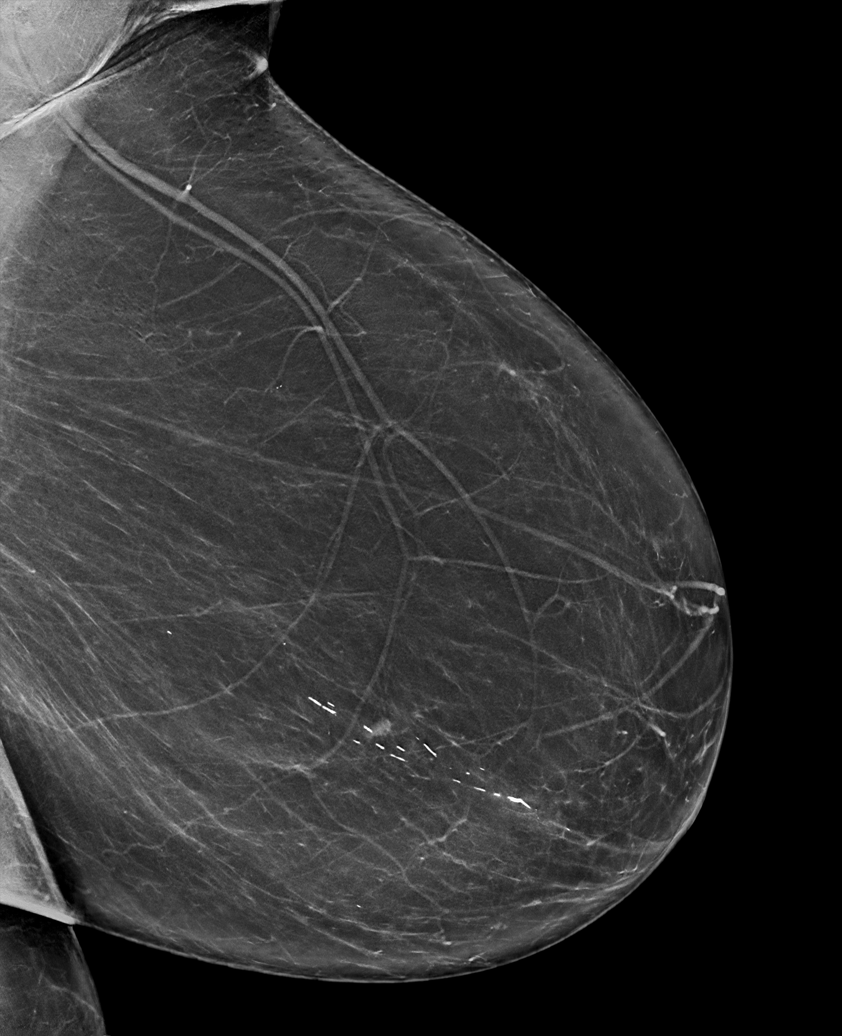

[R CC synth-2D]
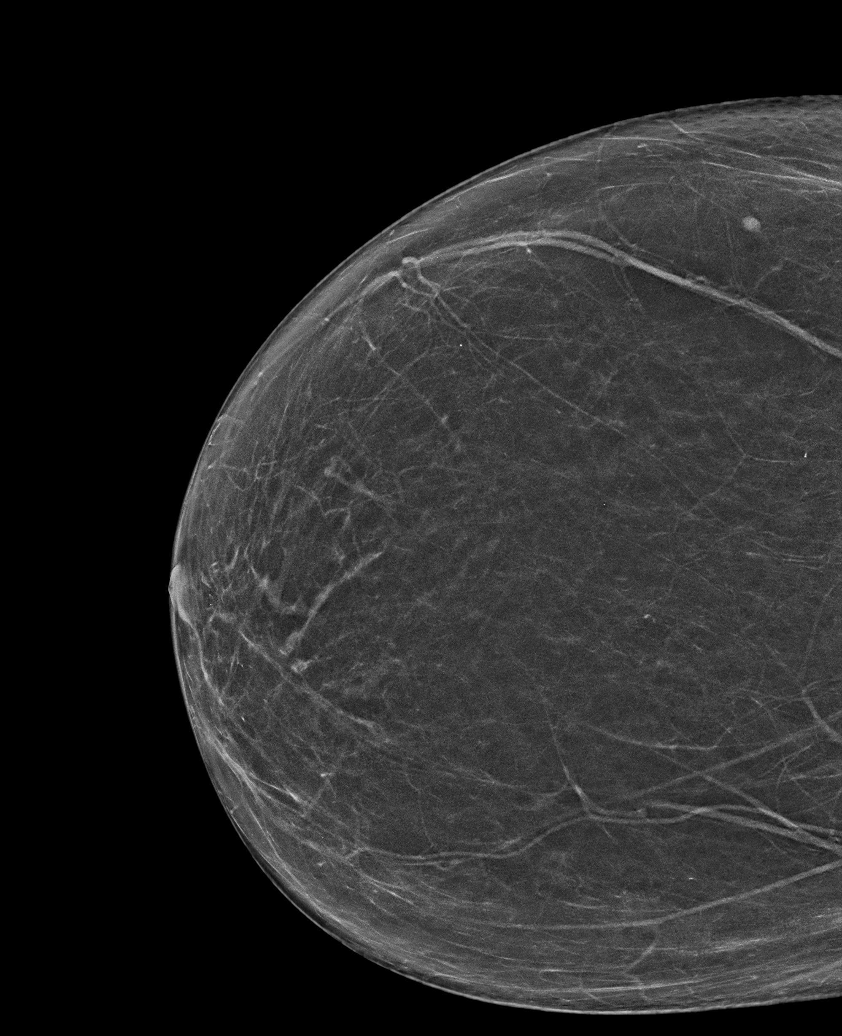

[L ML synth-2D]
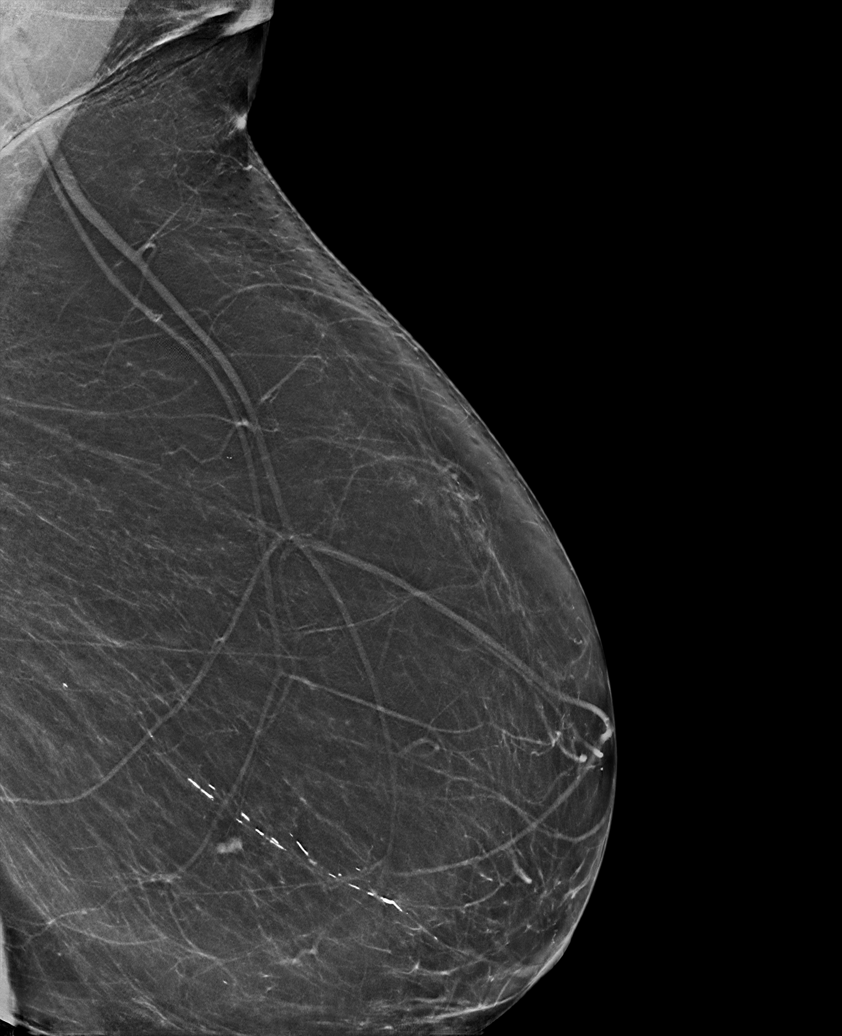

[L CC synth-2D]
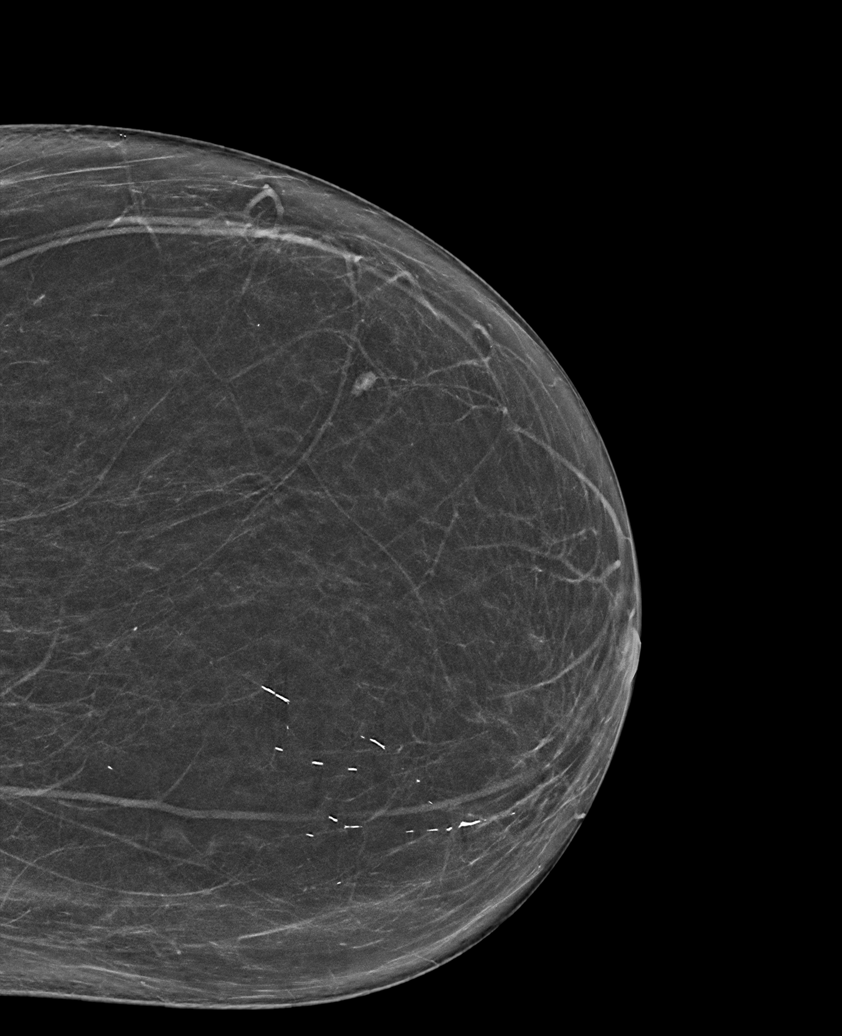

[R MLO synth-2D]
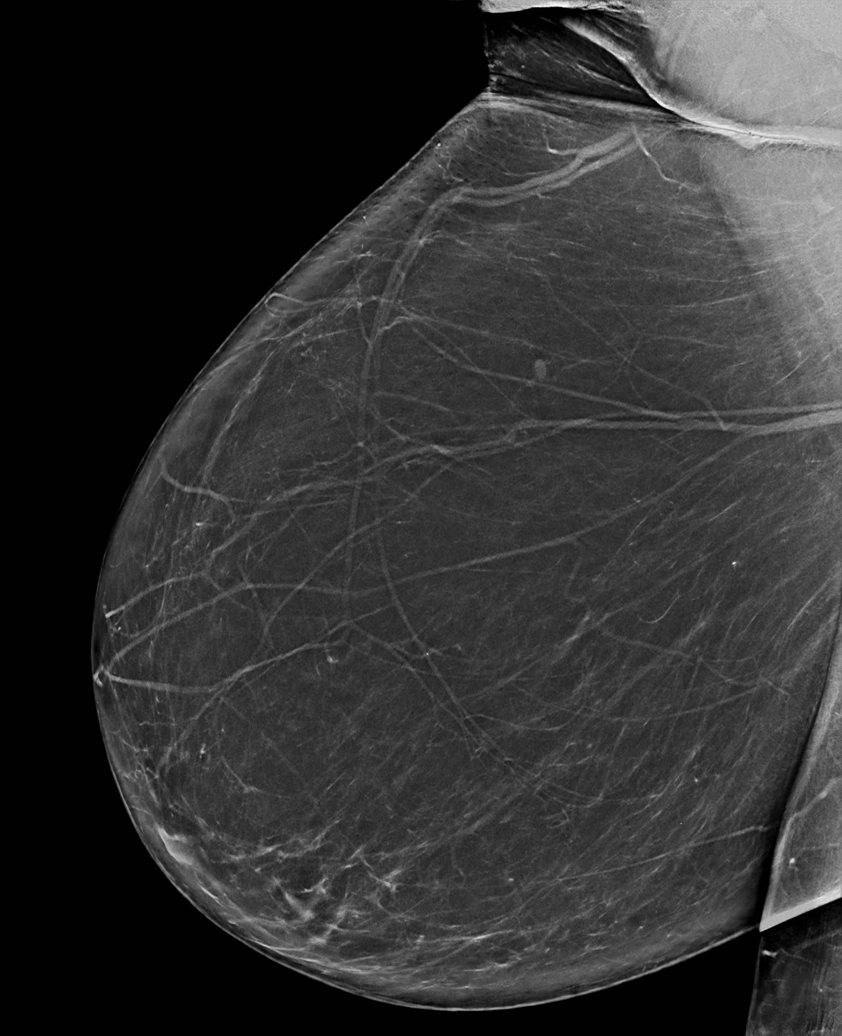

[L MLO tomo · tomo slice 41/82.0]
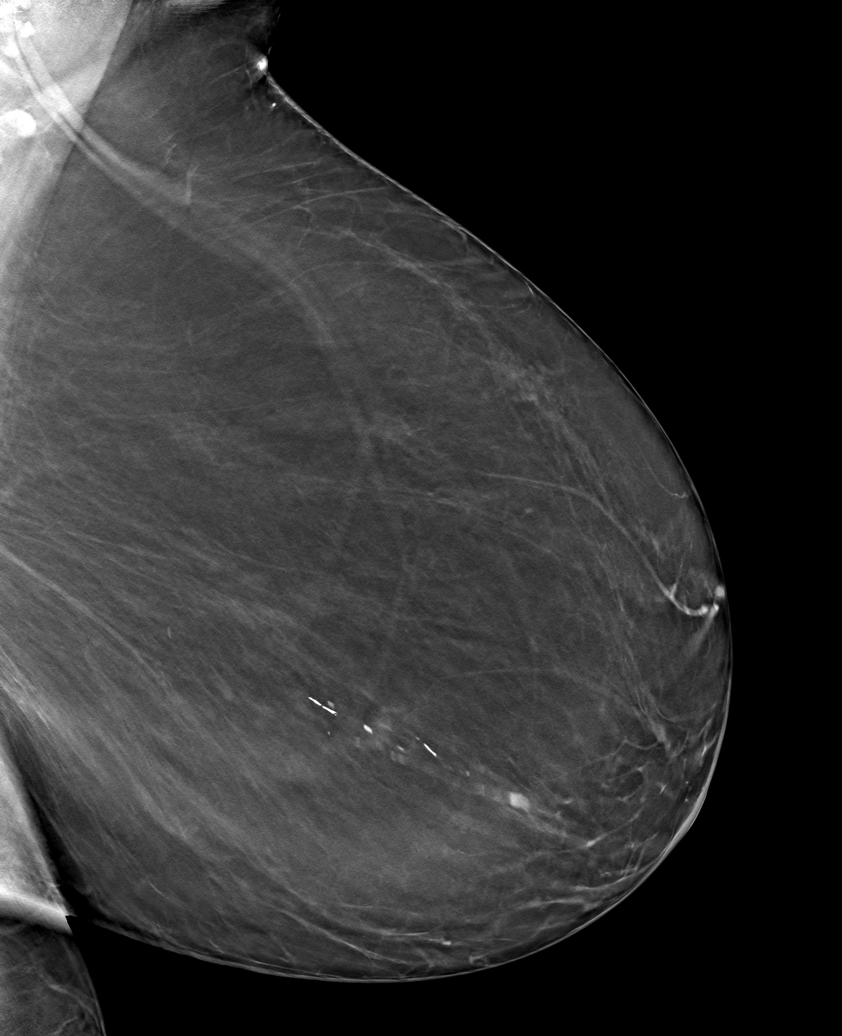

[6 of 30 positions shown; findings below may reference images not displayed]

FINDINGS: There are no findings suspicious for malignancy.
IMPRESSION: No mammographic evidence of malignancy. A result letter of this
screening mammogram will be mailed directly to the patient.

RECOMMENDATION:
Screening mammogram in one year. (Code:44-M-M6Q)

BI-RADS CATEGORY  1: Negative.

## 2023-01-10 DIAGNOSIS — Z23 Encounter for immunization: Secondary | ICD-10-CM | POA: Diagnosis not present

## 2023-01-30 ENCOUNTER — Ambulatory Visit (HOSPITAL_COMMUNITY)
Admission: RE | Admit: 2023-01-30 | Discharge: 2023-01-30 | Disposition: A | Discharge status: Home / Self Care | Payer: Medicare Other | Source: Ambulatory Visit | Attending: Internal Medicine | Admitting: Internal Medicine

## 2023-01-30 ENCOUNTER — Other Ambulatory Visit (HOSPITAL_COMMUNITY): Payer: Self-pay | Admitting: Internal Medicine

## 2023-01-30 DIAGNOSIS — R600 Localized edema: Secondary | ICD-10-CM

## 2023-01-30 DIAGNOSIS — R5383 Other fatigue: Secondary | ICD-10-CM | POA: Diagnosis not present

## 2023-01-30 DIAGNOSIS — Z1331 Encounter for screening for depression: Secondary | ICD-10-CM | POA: Diagnosis not present

## 2023-01-30 DIAGNOSIS — Z6832 Body mass index (BMI) 32.0-32.9, adult: Secondary | ICD-10-CM | POA: Diagnosis not present

## 2023-01-30 DIAGNOSIS — D692 Other nonthrombocytopenic purpura: Secondary | ICD-10-CM | POA: Diagnosis not present

## 2023-01-30 DIAGNOSIS — Z1339 Encounter for screening examination for other mental health and behavioral disorders: Secondary | ICD-10-CM | POA: Diagnosis not present

## 2023-01-30 DIAGNOSIS — M25562 Pain in left knee: Secondary | ICD-10-CM | POA: Diagnosis not present

## 2023-01-30 DIAGNOSIS — E7800 Pure hypercholesterolemia, unspecified: Secondary | ICD-10-CM | POA: Diagnosis not present

## 2023-01-30 DIAGNOSIS — Z23 Encounter for immunization: Secondary | ICD-10-CM | POA: Diagnosis not present

## 2023-01-30 DIAGNOSIS — Z7189 Other specified counseling: Secondary | ICD-10-CM | POA: Diagnosis not present

## 2023-01-30 DIAGNOSIS — I10 Essential (primary) hypertension: Secondary | ICD-10-CM | POA: Diagnosis not present

## 2023-01-30 DIAGNOSIS — Z79899 Other long term (current) drug therapy: Secondary | ICD-10-CM | POA: Diagnosis not present

## 2023-01-30 DIAGNOSIS — Z299 Encounter for prophylactic measures, unspecified: Secondary | ICD-10-CM | POA: Diagnosis not present

## 2023-01-30 DIAGNOSIS — Z0000 Encounter for general adult medical examination without abnormal findings: Secondary | ICD-10-CM | POA: Diagnosis not present

## 2023-02-20 DIAGNOSIS — R600 Localized edema: Secondary | ICD-10-CM | POA: Diagnosis not present

## 2023-02-27 DIAGNOSIS — R600 Localized edema: Secondary | ICD-10-CM | POA: Diagnosis not present

## 2023-02-27 DIAGNOSIS — I10 Essential (primary) hypertension: Secondary | ICD-10-CM | POA: Diagnosis not present

## 2023-02-27 DIAGNOSIS — Z299 Encounter for prophylactic measures, unspecified: Secondary | ICD-10-CM | POA: Diagnosis not present

## 2023-04-04 ENCOUNTER — Ambulatory Visit
Admission: RE | Admit: 2023-04-04 | Discharge: 2023-04-04 | Disposition: A | Discharge status: Home / Self Care | Payer: Medicare Other | Source: Ambulatory Visit | Attending: Internal Medicine | Admitting: Internal Medicine

## 2023-04-04 ENCOUNTER — Other Ambulatory Visit: Payer: Self-pay | Admitting: Internal Medicine

## 2023-04-04 DIAGNOSIS — R600 Localized edema: Secondary | ICD-10-CM | POA: Diagnosis not present

## 2023-04-04 DIAGNOSIS — I10 Essential (primary) hypertension: Secondary | ICD-10-CM | POA: Diagnosis not present

## 2023-04-04 DIAGNOSIS — Z79899 Other long term (current) drug therapy: Secondary | ICD-10-CM | POA: Diagnosis not present

## 2023-04-04 DIAGNOSIS — Z1231 Encounter for screening mammogram for malignant neoplasm of breast: Secondary | ICD-10-CM

## 2023-04-04 DIAGNOSIS — Z299 Encounter for prophylactic measures, unspecified: Secondary | ICD-10-CM | POA: Diagnosis not present

## 2023-05-08 ENCOUNTER — Ambulatory Visit
Admission: RE | Admit: 2023-05-08 | Discharge: 2023-05-08 | Disposition: A | Discharge status: Home / Self Care | Payer: Medicare Other | Source: Ambulatory Visit | Attending: Internal Medicine | Admitting: Internal Medicine

## 2023-05-08 DIAGNOSIS — Z1231 Encounter for screening mammogram for malignant neoplasm of breast: Secondary | ICD-10-CM | POA: Diagnosis not present

## 2023-05-30 DIAGNOSIS — M19071 Primary osteoarthritis, right ankle and foot: Secondary | ICD-10-CM | POA: Diagnosis not present

## 2023-05-30 DIAGNOSIS — I10 Essential (primary) hypertension: Secondary | ICD-10-CM | POA: Diagnosis present

## 2023-05-30 DIAGNOSIS — Z01818 Encounter for other preprocedural examination: Secondary | ICD-10-CM | POA: Diagnosis not present

## 2023-05-30 DIAGNOSIS — Z9889 Other specified postprocedural states: Secondary | ICD-10-CM | POA: Diagnosis not present

## 2023-05-30 DIAGNOSIS — M25571 Pain in right ankle and joints of right foot: Secondary | ICD-10-CM | POA: Diagnosis not present

## 2023-05-30 DIAGNOSIS — M25471 Effusion, right ankle: Secondary | ICD-10-CM | POA: Diagnosis not present

## 2023-05-30 DIAGNOSIS — W1839XA Other fall on same level, initial encounter: Secondary | ICD-10-CM | POA: Diagnosis not present

## 2023-05-30 DIAGNOSIS — I6782 Cerebral ischemia: Secondary | ICD-10-CM | POA: Diagnosis not present

## 2023-05-30 DIAGNOSIS — S82454A Nondisplaced comminuted fracture of shaft of right fibula, initial encounter for closed fracture: Secondary | ICD-10-CM | POA: Diagnosis not present

## 2023-05-30 DIAGNOSIS — S82831A Other fracture of upper and lower end of right fibula, initial encounter for closed fracture: Secondary | ICD-10-CM | POA: Diagnosis not present

## 2023-05-30 DIAGNOSIS — S82301A Unspecified fracture of lower end of right tibia, initial encounter for closed fracture: Secondary | ICD-10-CM | POA: Diagnosis not present

## 2023-05-30 DIAGNOSIS — S8261XA Displaced fracture of lateral malleolus of right fibula, initial encounter for closed fracture: Secondary | ICD-10-CM | POA: Diagnosis not present

## 2023-05-30 DIAGNOSIS — G44309 Post-traumatic headache, unspecified, not intractable: Secondary | ICD-10-CM | POA: Diagnosis not present

## 2023-05-30 DIAGNOSIS — Z7902 Long term (current) use of antithrombotics/antiplatelets: Secondary | ICD-10-CM | POA: Diagnosis not present

## 2023-05-30 DIAGNOSIS — S82851A Displaced trimalleolar fracture of right lower leg, initial encounter for closed fracture: Secondary | ICD-10-CM | POA: Diagnosis present

## 2023-05-30 DIAGNOSIS — S8251XA Displaced fracture of medial malleolus of right tibia, initial encounter for closed fracture: Secondary | ICD-10-CM | POA: Diagnosis not present

## 2023-05-30 DIAGNOSIS — S82401A Unspecified fracture of shaft of right fibula, initial encounter for closed fracture: Secondary | ICD-10-CM | POA: Diagnosis not present

## 2023-05-30 DIAGNOSIS — I6381 Other cerebral infarction due to occlusion or stenosis of small artery: Secondary | ICD-10-CM | POA: Diagnosis not present

## 2023-05-30 DIAGNOSIS — G319 Degenerative disease of nervous system, unspecified: Secondary | ICD-10-CM | POA: Diagnosis not present

## 2023-05-31 DIAGNOSIS — Z01818 Encounter for other preprocedural examination: Secondary | ICD-10-CM | POA: Diagnosis not present

## 2023-05-31 DIAGNOSIS — S82301A Unspecified fracture of lower end of right tibia, initial encounter for closed fracture: Secondary | ICD-10-CM | POA: Diagnosis not present

## 2023-05-31 DIAGNOSIS — Z9889 Other specified postprocedural states: Secondary | ICD-10-CM | POA: Diagnosis not present

## 2023-05-31 DIAGNOSIS — S82851A Displaced trimalleolar fracture of right lower leg, initial encounter for closed fracture: Secondary | ICD-10-CM | POA: Diagnosis not present

## 2023-05-31 DIAGNOSIS — S82401A Unspecified fracture of shaft of right fibula, initial encounter for closed fracture: Secondary | ICD-10-CM | POA: Diagnosis not present

## 2023-06-01 DIAGNOSIS — I6381 Other cerebral infarction due to occlusion or stenosis of small artery: Secondary | ICD-10-CM | POA: Diagnosis not present

## 2023-06-01 DIAGNOSIS — I6782 Cerebral ischemia: Secondary | ICD-10-CM | POA: Diagnosis not present

## 2023-06-01 DIAGNOSIS — I10 Essential (primary) hypertension: Secondary | ICD-10-CM | POA: Diagnosis present

## 2023-06-01 DIAGNOSIS — G319 Degenerative disease of nervous system, unspecified: Secondary | ICD-10-CM | POA: Diagnosis not present

## 2023-06-01 DIAGNOSIS — Z7902 Long term (current) use of antithrombotics/antiplatelets: Secondary | ICD-10-CM | POA: Diagnosis not present

## 2023-06-01 DIAGNOSIS — S82851A Displaced trimalleolar fracture of right lower leg, initial encounter for closed fracture: Secondary | ICD-10-CM | POA: Diagnosis present

## 2023-06-05 DIAGNOSIS — Z7902 Long term (current) use of antithrombotics/antiplatelets: Secondary | ICD-10-CM | POA: Diagnosis not present

## 2023-06-05 DIAGNOSIS — S82851D Displaced trimalleolar fracture of right lower leg, subsequent encounter for closed fracture with routine healing: Secondary | ICD-10-CM | POA: Diagnosis not present

## 2023-06-05 DIAGNOSIS — I10 Essential (primary) hypertension: Secondary | ICD-10-CM | POA: Diagnosis not present

## 2023-06-05 DIAGNOSIS — W19XXXD Unspecified fall, subsequent encounter: Secondary | ICD-10-CM | POA: Diagnosis not present

## 2023-06-08 DIAGNOSIS — Z7902 Long term (current) use of antithrombotics/antiplatelets: Secondary | ICD-10-CM | POA: Diagnosis not present

## 2023-06-08 DIAGNOSIS — W19XXXD Unspecified fall, subsequent encounter: Secondary | ICD-10-CM | POA: Diagnosis not present

## 2023-06-08 DIAGNOSIS — S82851D Displaced trimalleolar fracture of right lower leg, subsequent encounter for closed fracture with routine healing: Secondary | ICD-10-CM | POA: Diagnosis not present

## 2023-06-08 DIAGNOSIS — I10 Essential (primary) hypertension: Secondary | ICD-10-CM | POA: Diagnosis not present

## 2023-06-12 DIAGNOSIS — Z7902 Long term (current) use of antithrombotics/antiplatelets: Secondary | ICD-10-CM | POA: Diagnosis not present

## 2023-06-12 DIAGNOSIS — S82851D Displaced trimalleolar fracture of right lower leg, subsequent encounter for closed fracture with routine healing: Secondary | ICD-10-CM | POA: Diagnosis not present

## 2023-06-12 DIAGNOSIS — W19XXXD Unspecified fall, subsequent encounter: Secondary | ICD-10-CM | POA: Diagnosis not present

## 2023-06-12 DIAGNOSIS — I10 Essential (primary) hypertension: Secondary | ICD-10-CM | POA: Diagnosis not present

## 2023-06-15 DIAGNOSIS — M25571 Pain in right ankle and joints of right foot: Secondary | ICD-10-CM | POA: Diagnosis not present

## 2023-06-16 DIAGNOSIS — S82851D Displaced trimalleolar fracture of right lower leg, subsequent encounter for closed fracture with routine healing: Secondary | ICD-10-CM | POA: Diagnosis not present

## 2023-06-16 DIAGNOSIS — I10 Essential (primary) hypertension: Secondary | ICD-10-CM | POA: Diagnosis not present

## 2023-06-16 DIAGNOSIS — Z7902 Long term (current) use of antithrombotics/antiplatelets: Secondary | ICD-10-CM | POA: Diagnosis not present

## 2023-06-16 DIAGNOSIS — W19XXXD Unspecified fall, subsequent encounter: Secondary | ICD-10-CM | POA: Diagnosis not present

## 2023-06-17 DIAGNOSIS — R001 Bradycardia, unspecified: Secondary | ICD-10-CM | POA: Diagnosis not present

## 2023-06-19 DIAGNOSIS — S82851D Displaced trimalleolar fracture of right lower leg, subsequent encounter for closed fracture with routine healing: Secondary | ICD-10-CM | POA: Diagnosis not present

## 2023-06-19 DIAGNOSIS — Z7902 Long term (current) use of antithrombotics/antiplatelets: Secondary | ICD-10-CM | POA: Diagnosis not present

## 2023-06-19 DIAGNOSIS — W19XXXD Unspecified fall, subsequent encounter: Secondary | ICD-10-CM | POA: Diagnosis not present

## 2023-06-19 DIAGNOSIS — I10 Essential (primary) hypertension: Secondary | ICD-10-CM | POA: Diagnosis not present

## 2023-06-22 DIAGNOSIS — I10 Essential (primary) hypertension: Secondary | ICD-10-CM | POA: Diagnosis not present

## 2023-06-22 DIAGNOSIS — W19XXXD Unspecified fall, subsequent encounter: Secondary | ICD-10-CM | POA: Diagnosis not present

## 2023-06-22 DIAGNOSIS — Z7902 Long term (current) use of antithrombotics/antiplatelets: Secondary | ICD-10-CM | POA: Diagnosis not present

## 2023-06-22 DIAGNOSIS — S82851D Displaced trimalleolar fracture of right lower leg, subsequent encounter for closed fracture with routine healing: Secondary | ICD-10-CM | POA: Diagnosis not present

## 2023-06-26 DIAGNOSIS — I10 Essential (primary) hypertension: Secondary | ICD-10-CM | POA: Diagnosis not present

## 2023-06-26 DIAGNOSIS — W19XXXD Unspecified fall, subsequent encounter: Secondary | ICD-10-CM | POA: Diagnosis not present

## 2023-06-26 DIAGNOSIS — Z7902 Long term (current) use of antithrombotics/antiplatelets: Secondary | ICD-10-CM | POA: Diagnosis not present

## 2023-06-26 DIAGNOSIS — S82851D Displaced trimalleolar fracture of right lower leg, subsequent encounter for closed fracture with routine healing: Secondary | ICD-10-CM | POA: Diagnosis not present

## 2023-06-29 DIAGNOSIS — Z7902 Long term (current) use of antithrombotics/antiplatelets: Secondary | ICD-10-CM | POA: Diagnosis not present

## 2023-06-29 DIAGNOSIS — W19XXXD Unspecified fall, subsequent encounter: Secondary | ICD-10-CM | POA: Diagnosis not present

## 2023-06-29 DIAGNOSIS — S82851D Displaced trimalleolar fracture of right lower leg, subsequent encounter for closed fracture with routine healing: Secondary | ICD-10-CM | POA: Diagnosis not present

## 2023-06-29 DIAGNOSIS — I10 Essential (primary) hypertension: Secondary | ICD-10-CM | POA: Diagnosis not present

## 2023-07-03 DIAGNOSIS — Z7902 Long term (current) use of antithrombotics/antiplatelets: Secondary | ICD-10-CM | POA: Diagnosis not present

## 2023-07-03 DIAGNOSIS — S82851D Displaced trimalleolar fracture of right lower leg, subsequent encounter for closed fracture with routine healing: Secondary | ICD-10-CM | POA: Diagnosis not present

## 2023-07-03 DIAGNOSIS — I10 Essential (primary) hypertension: Secondary | ICD-10-CM | POA: Diagnosis not present

## 2023-07-03 DIAGNOSIS — W19XXXD Unspecified fall, subsequent encounter: Secondary | ICD-10-CM | POA: Diagnosis not present

## 2023-07-05 DIAGNOSIS — Z7902 Long term (current) use of antithrombotics/antiplatelets: Secondary | ICD-10-CM | POA: Diagnosis not present

## 2023-07-05 DIAGNOSIS — I10 Essential (primary) hypertension: Secondary | ICD-10-CM | POA: Diagnosis not present

## 2023-07-05 DIAGNOSIS — S82851D Displaced trimalleolar fracture of right lower leg, subsequent encounter for closed fracture with routine healing: Secondary | ICD-10-CM | POA: Diagnosis not present

## 2023-07-05 DIAGNOSIS — W19XXXD Unspecified fall, subsequent encounter: Secondary | ICD-10-CM | POA: Diagnosis not present

## 2023-07-17 DIAGNOSIS — S82851A Displaced trimalleolar fracture of right lower leg, initial encounter for closed fracture: Secondary | ICD-10-CM | POA: Diagnosis not present

## 2023-07-17 DIAGNOSIS — X58XXXA Exposure to other specified factors, initial encounter: Secondary | ICD-10-CM | POA: Diagnosis not present

## 2023-07-19 DIAGNOSIS — Z7902 Long term (current) use of antithrombotics/antiplatelets: Secondary | ICD-10-CM | POA: Diagnosis not present

## 2023-07-19 DIAGNOSIS — S82851D Displaced trimalleolar fracture of right lower leg, subsequent encounter for closed fracture with routine healing: Secondary | ICD-10-CM | POA: Diagnosis not present

## 2023-07-19 DIAGNOSIS — I10 Essential (primary) hypertension: Secondary | ICD-10-CM | POA: Diagnosis not present

## 2023-07-19 DIAGNOSIS — W19XXXD Unspecified fall, subsequent encounter: Secondary | ICD-10-CM | POA: Diagnosis not present

## 2023-07-24 DIAGNOSIS — W19XXXD Unspecified fall, subsequent encounter: Secondary | ICD-10-CM | POA: Diagnosis not present

## 2023-07-24 DIAGNOSIS — S82851D Displaced trimalleolar fracture of right lower leg, subsequent encounter for closed fracture with routine healing: Secondary | ICD-10-CM | POA: Diagnosis not present

## 2023-07-24 DIAGNOSIS — Z7902 Long term (current) use of antithrombotics/antiplatelets: Secondary | ICD-10-CM | POA: Diagnosis not present

## 2023-07-24 DIAGNOSIS — I10 Essential (primary) hypertension: Secondary | ICD-10-CM | POA: Diagnosis not present

## 2023-07-27 DIAGNOSIS — Z7902 Long term (current) use of antithrombotics/antiplatelets: Secondary | ICD-10-CM | POA: Diagnosis not present

## 2023-07-27 DIAGNOSIS — S82851D Displaced trimalleolar fracture of right lower leg, subsequent encounter for closed fracture with routine healing: Secondary | ICD-10-CM | POA: Diagnosis not present

## 2023-07-27 DIAGNOSIS — W19XXXD Unspecified fall, subsequent encounter: Secondary | ICD-10-CM | POA: Diagnosis not present

## 2023-07-27 DIAGNOSIS — I10 Essential (primary) hypertension: Secondary | ICD-10-CM | POA: Diagnosis not present

## 2023-07-31 DIAGNOSIS — Z7902 Long term (current) use of antithrombotics/antiplatelets: Secondary | ICD-10-CM | POA: Diagnosis not present

## 2023-07-31 DIAGNOSIS — S82851D Displaced trimalleolar fracture of right lower leg, subsequent encounter for closed fracture with routine healing: Secondary | ICD-10-CM | POA: Diagnosis not present

## 2023-07-31 DIAGNOSIS — I10 Essential (primary) hypertension: Secondary | ICD-10-CM | POA: Diagnosis not present

## 2023-07-31 DIAGNOSIS — W19XXXD Unspecified fall, subsequent encounter: Secondary | ICD-10-CM | POA: Diagnosis not present

## 2023-08-02 DIAGNOSIS — I10 Essential (primary) hypertension: Secondary | ICD-10-CM | POA: Diagnosis not present

## 2023-08-02 DIAGNOSIS — Z7902 Long term (current) use of antithrombotics/antiplatelets: Secondary | ICD-10-CM | POA: Diagnosis not present

## 2023-08-02 DIAGNOSIS — W19XXXD Unspecified fall, subsequent encounter: Secondary | ICD-10-CM | POA: Diagnosis not present

## 2023-08-02 DIAGNOSIS — S82851D Displaced trimalleolar fracture of right lower leg, subsequent encounter for closed fracture with routine healing: Secondary | ICD-10-CM | POA: Diagnosis not present

## 2023-08-04 DIAGNOSIS — I10 Essential (primary) hypertension: Secondary | ICD-10-CM | POA: Diagnosis not present

## 2023-08-04 DIAGNOSIS — Z7902 Long term (current) use of antithrombotics/antiplatelets: Secondary | ICD-10-CM | POA: Diagnosis not present

## 2023-08-04 DIAGNOSIS — W19XXXD Unspecified fall, subsequent encounter: Secondary | ICD-10-CM | POA: Diagnosis not present

## 2023-08-04 DIAGNOSIS — S82851D Displaced trimalleolar fracture of right lower leg, subsequent encounter for closed fracture with routine healing: Secondary | ICD-10-CM | POA: Diagnosis not present

## 2023-08-07 DIAGNOSIS — S82851D Displaced trimalleolar fracture of right lower leg, subsequent encounter for closed fracture with routine healing: Secondary | ICD-10-CM | POA: Diagnosis not present

## 2023-08-07 DIAGNOSIS — I10 Essential (primary) hypertension: Secondary | ICD-10-CM | POA: Diagnosis not present

## 2023-08-07 DIAGNOSIS — Z7902 Long term (current) use of antithrombotics/antiplatelets: Secondary | ICD-10-CM | POA: Diagnosis not present

## 2023-08-07 DIAGNOSIS — W19XXXD Unspecified fall, subsequent encounter: Secondary | ICD-10-CM | POA: Diagnosis not present

## 2023-08-10 DIAGNOSIS — W19XXXD Unspecified fall, subsequent encounter: Secondary | ICD-10-CM | POA: Diagnosis not present

## 2023-08-10 DIAGNOSIS — Z7902 Long term (current) use of antithrombotics/antiplatelets: Secondary | ICD-10-CM | POA: Diagnosis not present

## 2023-08-10 DIAGNOSIS — S82851D Displaced trimalleolar fracture of right lower leg, subsequent encounter for closed fracture with routine healing: Secondary | ICD-10-CM | POA: Diagnosis not present

## 2023-08-10 DIAGNOSIS — I10 Essential (primary) hypertension: Secondary | ICD-10-CM | POA: Diagnosis not present

## 2023-08-14 DIAGNOSIS — S82851D Displaced trimalleolar fracture of right lower leg, subsequent encounter for closed fracture with routine healing: Secondary | ICD-10-CM | POA: Diagnosis not present

## 2023-08-14 DIAGNOSIS — W19XXXD Unspecified fall, subsequent encounter: Secondary | ICD-10-CM | POA: Diagnosis not present

## 2023-08-14 DIAGNOSIS — I10 Essential (primary) hypertension: Secondary | ICD-10-CM | POA: Diagnosis not present

## 2023-08-14 DIAGNOSIS — Z7902 Long term (current) use of antithrombotics/antiplatelets: Secondary | ICD-10-CM | POA: Diagnosis not present

## 2023-08-16 DIAGNOSIS — W19XXXD Unspecified fall, subsequent encounter: Secondary | ICD-10-CM | POA: Diagnosis not present

## 2023-08-16 DIAGNOSIS — I10 Essential (primary) hypertension: Secondary | ICD-10-CM | POA: Diagnosis not present

## 2023-08-16 DIAGNOSIS — Z7902 Long term (current) use of antithrombotics/antiplatelets: Secondary | ICD-10-CM | POA: Diagnosis not present

## 2023-08-16 DIAGNOSIS — S82851D Displaced trimalleolar fracture of right lower leg, subsequent encounter for closed fracture with routine healing: Secondary | ICD-10-CM | POA: Diagnosis not present

## 2023-08-21 DIAGNOSIS — S82851D Displaced trimalleolar fracture of right lower leg, subsequent encounter for closed fracture with routine healing: Secondary | ICD-10-CM | POA: Diagnosis not present

## 2023-08-21 DIAGNOSIS — Z7902 Long term (current) use of antithrombotics/antiplatelets: Secondary | ICD-10-CM | POA: Diagnosis not present

## 2023-08-21 DIAGNOSIS — I10 Essential (primary) hypertension: Secondary | ICD-10-CM | POA: Diagnosis not present

## 2023-08-21 DIAGNOSIS — W19XXXD Unspecified fall, subsequent encounter: Secondary | ICD-10-CM | POA: Diagnosis not present

## 2023-08-23 DIAGNOSIS — I10 Essential (primary) hypertension: Secondary | ICD-10-CM | POA: Diagnosis not present

## 2023-08-23 DIAGNOSIS — S82851D Displaced trimalleolar fracture of right lower leg, subsequent encounter for closed fracture with routine healing: Secondary | ICD-10-CM | POA: Diagnosis not present

## 2023-08-23 DIAGNOSIS — Z7902 Long term (current) use of antithrombotics/antiplatelets: Secondary | ICD-10-CM | POA: Diagnosis not present

## 2023-08-23 DIAGNOSIS — W19XXXD Unspecified fall, subsequent encounter: Secondary | ICD-10-CM | POA: Diagnosis not present

## 2023-08-28 DIAGNOSIS — S82851A Displaced trimalleolar fracture of right lower leg, initial encounter for closed fracture: Secondary | ICD-10-CM | POA: Diagnosis not present

## 2023-08-29 DIAGNOSIS — I10 Essential (primary) hypertension: Secondary | ICD-10-CM | POA: Diagnosis not present

## 2023-08-29 DIAGNOSIS — W19XXXD Unspecified fall, subsequent encounter: Secondary | ICD-10-CM | POA: Diagnosis not present

## 2023-08-29 DIAGNOSIS — S82851D Displaced trimalleolar fracture of right lower leg, subsequent encounter for closed fracture with routine healing: Secondary | ICD-10-CM | POA: Diagnosis not present

## 2023-08-29 DIAGNOSIS — Z7902 Long term (current) use of antithrombotics/antiplatelets: Secondary | ICD-10-CM | POA: Diagnosis not present

## 2023-09-03 DIAGNOSIS — I10 Essential (primary) hypertension: Secondary | ICD-10-CM | POA: Diagnosis not present

## 2023-09-03 DIAGNOSIS — S82851D Displaced trimalleolar fracture of right lower leg, subsequent encounter for closed fracture with routine healing: Secondary | ICD-10-CM | POA: Diagnosis not present

## 2023-09-03 DIAGNOSIS — W19XXXD Unspecified fall, subsequent encounter: Secondary | ICD-10-CM | POA: Diagnosis not present

## 2023-09-03 DIAGNOSIS — Z7902 Long term (current) use of antithrombotics/antiplatelets: Secondary | ICD-10-CM | POA: Diagnosis not present

## 2023-09-04 DIAGNOSIS — S82851D Displaced trimalleolar fracture of right lower leg, subsequent encounter for closed fracture with routine healing: Secondary | ICD-10-CM | POA: Diagnosis not present

## 2023-09-04 DIAGNOSIS — W19XXXD Unspecified fall, subsequent encounter: Secondary | ICD-10-CM | POA: Diagnosis not present

## 2023-09-04 DIAGNOSIS — Z7902 Long term (current) use of antithrombotics/antiplatelets: Secondary | ICD-10-CM | POA: Diagnosis not present

## 2023-09-04 DIAGNOSIS — I10 Essential (primary) hypertension: Secondary | ICD-10-CM | POA: Diagnosis not present

## 2023-09-22 DIAGNOSIS — S82851D Displaced trimalleolar fracture of right lower leg, subsequent encounter for closed fracture with routine healing: Secondary | ICD-10-CM | POA: Diagnosis not present

## 2023-09-22 DIAGNOSIS — Z7902 Long term (current) use of antithrombotics/antiplatelets: Secondary | ICD-10-CM | POA: Diagnosis not present

## 2023-09-22 DIAGNOSIS — W19XXXD Unspecified fall, subsequent encounter: Secondary | ICD-10-CM | POA: Diagnosis not present

## 2023-09-22 DIAGNOSIS — I10 Essential (primary) hypertension: Secondary | ICD-10-CM | POA: Diagnosis not present

## 2023-10-10 DIAGNOSIS — I639 Cerebral infarction, unspecified: Secondary | ICD-10-CM | POA: Diagnosis not present

## 2024-01-29 DIAGNOSIS — Z1331 Encounter for screening for depression: Secondary | ICD-10-CM | POA: Diagnosis not present

## 2024-01-29 DIAGNOSIS — I10 Essential (primary) hypertension: Secondary | ICD-10-CM | POA: Diagnosis not present

## 2024-01-29 DIAGNOSIS — Z79899 Other long term (current) drug therapy: Secondary | ICD-10-CM | POA: Diagnosis not present

## 2024-01-29 DIAGNOSIS — Z0000 Encounter for general adult medical examination without abnormal findings: Secondary | ICD-10-CM | POA: Diagnosis not present

## 2024-01-29 DIAGNOSIS — R5383 Other fatigue: Secondary | ICD-10-CM | POA: Diagnosis not present

## 2024-01-29 DIAGNOSIS — Z23 Encounter for immunization: Secondary | ICD-10-CM | POA: Diagnosis not present

## 2024-01-29 DIAGNOSIS — Z7189 Other specified counseling: Secondary | ICD-10-CM | POA: Diagnosis not present

## 2024-01-29 DIAGNOSIS — Z1339 Encounter for screening examination for other mental health and behavioral disorders: Secondary | ICD-10-CM | POA: Diagnosis not present

## 2024-01-29 DIAGNOSIS — E7800 Pure hypercholesterolemia, unspecified: Secondary | ICD-10-CM | POA: Diagnosis not present

## 2024-01-29 DIAGNOSIS — Z299 Encounter for prophylactic measures, unspecified: Secondary | ICD-10-CM | POA: Diagnosis not present

## 2024-02-26 DIAGNOSIS — Z23 Encounter for immunization: Secondary | ICD-10-CM | POA: Diagnosis not present

## 2024-02-26 DIAGNOSIS — S82851D Displaced trimalleolar fracture of right lower leg, subsequent encounter for closed fracture with routine healing: Secondary | ICD-10-CM | POA: Diagnosis not present

## 2024-05-08 ENCOUNTER — Inpatient Hospital Stay: Admission: RE | Admit: 2024-05-08 | Payer: PRIVATE HEALTH INSURANCE

## 2024-05-24 ENCOUNTER — Other Ambulatory Visit: Payer: Self-pay | Admitting: Internal Medicine

## 2024-05-24 DIAGNOSIS — Z1231 Encounter for screening mammogram for malignant neoplasm of breast: Secondary | ICD-10-CM

## 2024-05-28 ENCOUNTER — Inpatient Hospital Stay
Admission: RE | Admit: 2024-05-28 | Discharge: 2024-05-28 | Disposition: A | Source: Ambulatory Visit | Attending: Internal Medicine | Admitting: Internal Medicine

## 2024-05-28 DIAGNOSIS — Z1231 Encounter for screening mammogram for malignant neoplasm of breast: Secondary | ICD-10-CM
